# Patient Record
Sex: Male | Born: 1945 | Race: White | Hispanic: No | Smoking: Former smoker
Health system: Southern US, Community
[De-identification: ages and names within clinical notes are randomized; demographics above are authoritative.]

## PROBLEM LIST (undated history)

## (undated) DIAGNOSIS — I6509 Occlusion and stenosis of unspecified vertebral artery: Secondary | ICD-10-CM

## (undated) DIAGNOSIS — G8190 Hemiplegia, unspecified affecting unspecified side: Secondary | ICD-10-CM

## (undated) DIAGNOSIS — E785 Hyperlipidemia, unspecified: Secondary | ICD-10-CM

## (undated) DIAGNOSIS — I6350 Cerebral infarction due to unspecified occlusion or stenosis of unspecified cerebral artery: Secondary | ICD-10-CM

## (undated) DIAGNOSIS — F1910 Other psychoactive substance abuse, uncomplicated: Secondary | ICD-10-CM

## (undated) DIAGNOSIS — E119 Type 2 diabetes mellitus without complications: Secondary | ICD-10-CM

## (undated) DIAGNOSIS — I10 Essential (primary) hypertension: Secondary | ICD-10-CM

## (undated) HISTORY — DX: Hemiplegia, unspecified affecting unspecified side: G81.90

## (undated) HISTORY — DX: Type 2 diabetes mellitus without complications: E11.9

## (undated) HISTORY — DX: Occlusion and stenosis of unspecified vertebral artery: I65.09

## (undated) HISTORY — DX: Essential (primary) hypertension: I10

## (undated) HISTORY — DX: Hyperlipidemia, unspecified: E78.5

## (undated) HISTORY — DX: Other psychoactive substance abuse, uncomplicated: F19.10

---

## 2010-03-05 ENCOUNTER — Other Ambulatory Visit: Payer: Self-pay | Admitting: General Practice

## 2011-08-22 DIAGNOSIS — I6350 Cerebral infarction due to unspecified occlusion or stenosis of unspecified cerebral artery: Secondary | ICD-10-CM

## 2011-08-22 HISTORY — DX: Cerebral infarction due to unspecified occlusion or stenosis of unspecified cerebral artery: I63.50

## 2013-03-30 ENCOUNTER — Non-Acute Institutional Stay (SKILLED_NURSING_FACILITY): Payer: Medicare Other | Admitting: Internal Medicine

## 2013-03-30 DIAGNOSIS — D72829 Elevated white blood cell count, unspecified: Secondary | ICD-10-CM

## 2013-03-30 DIAGNOSIS — I10 Essential (primary) hypertension: Secondary | ICD-10-CM | POA: Insufficient documentation

## 2013-03-30 DIAGNOSIS — E785 Hyperlipidemia, unspecified: Secondary | ICD-10-CM | POA: Insufficient documentation

## 2013-03-30 DIAGNOSIS — I6350 Cerebral infarction due to unspecified occlusion or stenosis of unspecified cerebral artery: Secondary | ICD-10-CM

## 2013-03-30 DIAGNOSIS — E119 Type 2 diabetes mellitus without complications: Secondary | ICD-10-CM | POA: Insufficient documentation

## 2013-03-30 DIAGNOSIS — I639 Cerebral infarction, unspecified: Secondary | ICD-10-CM | POA: Insufficient documentation

## 2013-03-30 DIAGNOSIS — G609 Hereditary and idiopathic neuropathy, unspecified: Secondary | ICD-10-CM | POA: Insufficient documentation

## 2013-03-30 DIAGNOSIS — F29 Unspecified psychosis not due to a substance or known physiological condition: Secondary | ICD-10-CM

## 2013-03-30 NOTE — Progress Notes (Signed)
Patient ID: [name], male   DOB: [birth year], 67 y.o.   MRN: [id number] This is an acute visit.  Total care skilled.  Facility to excrete.  Chief complaint.  Medical management of hypertension CVA late effect hyperlipidemia diabetes depression neuropathy.  History of present illness.  Patient is a pleasant elderly resident with a history of CVA x2.  Initially during his stay here there were significant behavior issues However these have markedly improved here  He is on Seroquel as well as Prozac and this appears to be quite effective-he is bright alert somewhat confused however.  His other issues appear to be stable.  Diabetes appears to be under decent control on metformin CBGs ranged from the 80s-low 100s.  Blood pressure appears stable with recent readings 132/70-148/66-112/65.  His weight also continues to be stable he apparently eats quite well   In the past he did complain of some neuropathy in his legs at night he is on Neurontin twice a day and this appears to have helped significantly as well.  He has no complaints tonight is pleasant smiling.  Family medical social history has been reviewed per progress note of 12/24/2012.  Medications have been reviewed per MAR.  Review of systems-limited secondary to patient being a poor historian however he is not complaining of any shortness of breath chest pain or numbness.  He appears to be happy his only complaint is he would like his  Snack--.  Physical exam.  Temperature 97.1 pulse 66 respirations 19 blood pressure 132/70 weight is stable at 152.  In general this is a pleasant somewhat frail elderly male in no distress lying comfortably in bed.  His skin is warm and dry.  Eyes pupils appear equal round reactive to light sclera and conjunctiva are clear visual acuity appears intact.  Oropharynx clear mucous membranes moist he has numerous extractions.  Chest is clear to auscultation without rhonchi rales or  wheezes.  Heart is regular rate and rhythm without murmur gallop or rub.  He does not have any lower extremity edema.  Abdomen is soft nontender with positive bowel sounds.  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  Neurologic-as stated above cranial nerves appear grossly intact his speech is clear he is oriented to self is pleasant.  Labs.  02/23/2013.  Cholesterol was 146 triglycerides 164 HDL 28 LDL of 90.  03/26/2013.  Hemoglobin A1c 5.9. 12/29/2012.  WBC 9.1 hemoglobin 13.3 platelets 312.  BUN 12 creatinine 0.59 sodium 142 potassium 3.8.  Liver function tests within normal limits.  Assessment and plan.  #1 history of cerebral vascular disease-this appears to be stable he is on Plavix continued with some right-sided weakness which is not new.  #2-history diabetes type 2-this is quite stable on Glucophage CBG satisfactory as well as recent hemoglobin A1c.  #3-hypertension this appears stable he is on lisinopril.  Will update metabolic panel.  #4-hyperlipidemia-continues on pravastatin this appears to be relatively stable.  #5-history of anxiety-this appears significantly improved he has Ativan when necessary but does not appear to take this very much.  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  #7-neuropathy-this appears stable on Neurontin twice a day.  #8-he does have a history of leukocytosis that is asymptomatic-Will update CBC keep an eye on this  [id number]-of note 30 minutes spent assessing patient-and formulating a plan of  care for numerous diagnoses

## 2013-06-02 ENCOUNTER — Non-Acute Institutional Stay (SKILLED_NURSING_FACILITY): Payer: Medicare Other | Admitting: Internal Medicine

## 2013-06-02 DIAGNOSIS — G609 Hereditary and idiopathic neuropathy, unspecified: Secondary | ICD-10-CM

## 2013-06-02 DIAGNOSIS — I639 Cerebral infarction, unspecified: Secondary | ICD-10-CM

## 2013-06-02 DIAGNOSIS — E785 Hyperlipidemia, unspecified: Secondary | ICD-10-CM

## 2013-06-02 DIAGNOSIS — I10 Essential (primary) hypertension: Secondary | ICD-10-CM

## 2013-06-02 DIAGNOSIS — D72829 Elevated white blood cell count, unspecified: Secondary | ICD-10-CM

## 2013-06-02 DIAGNOSIS — I6350 Cerebral infarction due to unspecified occlusion or stenosis of unspecified cerebral artery: Secondary | ICD-10-CM

## 2013-06-02 DIAGNOSIS — E119 Type 2 diabetes mellitus without complications: Secondary | ICD-10-CM

## 2013-06-02 NOTE — Progress Notes (Signed)
Patient ID: [name], male   DOB: 21-Jul-1946, 67 y.o.   MRN: [id number] This is an acute visit.   Total care skilled.   Facility [facility]   Chief complaint.   Medical management of hypertension CVA late effect hyperlipidemia diabetes depression neuropathy.   History of present illness.   Patient is a pleasant elderly resident with a history of CVA x2.   Initially during his stay here there were significant behavior issues However these have markedly improved here   He is on Seroquel as well as Prozac and this appears to be quite effective-he is bright alert somewhat confused however.   His other issues appear to be stable.   Diabetes appears to be under decent control on metformin CBGs are mainly in the low 100s-recent hemoglobin A1c was satisfactory at 5.7.   Blood pressure appears stable with recent readings 128/72-120/60.   His weight also continues to be stable he apparently eats quite well    In the past he did complain of some neuropathy in his legs at night he is on Neurontin twice a day and this appears to have helped significantly as well.   He has no complaints tonight is pleasant smiling.   Family medical social history has been reviewed per progress note of 12/24/2012.   Medications have been reviewed per MAR.   Review of systems-limited secondary to patient being a poor historian however he is not complaining of any shortness of breath chest pain or numbness.   --.   Physical exam.   Temperature 98.4 pulse 74 respirations 18 blood pressure 128/72 weight is stable at 150.   In general this is a pleasant somewhat frail elderly male in no distress lying comfortably in bed.   His skin is warm and dry.   Eyes pupils appear equal round reactive to light sclera and conjunctiva are clear visual acuity appears intact.   Oropharynx clear mucous membranes moist he has numerous extractions.   Chest is clear to auscultation without  rhonchi rales or wheezes.   Heart is regular rate and rhythm without murmur gallop or rub.   He does not have any lower extremity edema.   Abdomen is soft nontender with positive bowel sounds.   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   Neurologic-as stated above cranial nerves appear grossly intact his speech is clear he is oriented to self is pleasant.   Labs.  04/22/2013.  Hemoglobin A1c 5.7.  04/01/2013.  WBC 11.2 hemoglobin 12.8 platelets 326.  Neutrophils 7.9.  Sodium 142 potassium 3.8 BUN 13 creatinine 0.66   02/23/2013.   Cholesterol was 146 triglycerides 164 HDL 28 LDL of 90.   03/26/2013.   Hemoglobin A1c 5.9. 12/29/2012.   WBC 9.1 hemoglobin 13.3 platelets 312.   BUN 12 creatinine 0.59 sodium 142 potassium 3.8.   Liver function tests within normal limits.   Assessment and plan.   #1 history of cerebral vascular disease-this appears to be stable he is on Plavix continues with some right-sided weakness which is not new.   #2-history diabetes type 2-this is quite stable on Glucophage CBG satisfactory as well as recent hemoglobin A1c.   #3-hypertension this appears stable he is on lisinopril.--Will update metabolic panel      #4-hyperlipidemia-continues on pravastatin this appears to be relatively stable.   #5-history of anxiety-this appears significantly improved he has Ativan when  necessary but does not appear to take this very much.   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   #7-neuropathy-this appears stable on Neurontin twice a day.   #8-he does have a history of leukocytosis that is asymptomatic-Will update CBC keep an eye on this   [phone]

## 2013-07-22 DIAGNOSIS — D72829 Elevated white blood cell count, unspecified: Secondary | ICD-10-CM | POA: Insufficient documentation

## 2013-08-02 ENCOUNTER — Encounter: Payer: Self-pay | Admitting: Nurse Practitioner

## 2013-08-03 ENCOUNTER — Encounter: Payer: Medicare Other | Admitting: Nurse Practitioner

## 2013-08-03 DIAGNOSIS — Z0289 Encounter for other administrative examinations: Secondary | ICD-10-CM

## 2013-08-17 ENCOUNTER — Non-Acute Institutional Stay (SKILLED_NURSING_FACILITY): Payer: Medicare Other | Admitting: Internal Medicine

## 2013-08-17 DIAGNOSIS — E119 Type 2 diabetes mellitus without complications: Secondary | ICD-10-CM

## 2013-08-17 DIAGNOSIS — D72829 Elevated white blood cell count, unspecified: Secondary | ICD-10-CM

## 2013-08-17 DIAGNOSIS — I6350 Cerebral infarction due to unspecified occlusion or stenosis of unspecified cerebral artery: Secondary | ICD-10-CM

## 2013-08-17 DIAGNOSIS — G609 Hereditary and idiopathic neuropathy, unspecified: Secondary | ICD-10-CM

## 2013-08-17 DIAGNOSIS — I639 Cerebral infarction, unspecified: Secondary | ICD-10-CM

## 2013-08-17 DIAGNOSIS — E785 Hyperlipidemia, unspecified: Secondary | ICD-10-CM

## 2013-08-17 DIAGNOSIS — I10 Essential (primary) hypertension: Secondary | ICD-10-CM

## 2013-08-17 NOTE — Progress Notes (Signed)
Patient ID: [name], male   DOB: May 20, 1946, 67 y.o.   MRN: [id number] This is a routine visit.  Level of care skilled  Facility [facility]   Chief complaint.  Medical management of hypertension CVA late effect hyperlipidemia diabetes depression neuropathy .  History of present illness.  Patient is a pleasant elderly resident with a history of CVA x2.  Initially during his stay here there were significant behavior issues However these have markedly improved here  He is on Seroquel as well as Prozac and this appears to be quite effective-he is bright alert somewhat confused however.  His other issues appear to be stable.  Diabetes appears to be under decent control on metformin CBGs are mainly in the low 100s-recent hemoglobin A1c was satisfactory at 5.7.  Blood pressure appears stable with recent readings 116/72-136/78.  His weight also continues to be stable he apparently eats quite well  In the past he did complain of some neuropathy in his legs at night he is on Neurontin twice a day and this appears to have helped significantly as well.  He has no complaints todayt is pleasant smiling .  Family medical social history has been reviewed per progress note of 12/24/2012 .  Medications have been reviewed per MAR.   Review of systems-limited secondary to patient being a poor historian however he is not complaining of any shortness of breath chest pain or numbness.  --.  Physical exam.  Temperature 98.4 pulse 74 respirations 18 blood pressure 128/72 weight is stable at 152 .  In general this is a pleasant somewhat frail elderly male in no distress lying comfortably in bed.  His skin is warm and dry.  Eyes--limited exam since patient shut his eyes quite tightly at . Any attempt at exam.  Oropharynx clear mucous membranes moist he has numerous extractions.  Chest is clear to auscultation without rhonchi rales or wheezes.  Heart is regular rate and rhythm without murmur gallop or  rub.  He does not have any lower extremity edema.  Abdomen is soft nontender with positive bowel sounds.  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  Neurologic-as stated above cranial nerves appear grossly intact his speech is clear he is oriented to self is pleasant .  Labs. 06/06/2013.  WBCs 9.7 hemoglobin 14.5 platelets 303.  BUN 14 creatinine 0.62 sodium 143 potassium 4.3.    04/22/2013.  Hemoglobin A1c 5.7.  04/01/2013.  WBC 11.2 hemoglobin 12.8 platelets 326.  Neutrophils 7.9.  Sodium 142 potassium 3.8 BUN 13 creatinine 0.66  02/23/2013.  Cholesterol was 146 triglycerides 164 HDL 28 LDL of 90.  03/26/2013.  Hemoglobin A1c 5.9.  12/29/2012.  WBC 9.1 hemoglobin 13.3 platelets 312.  BUN 12 creatinine 0.59 sodium 142 potassium 3.8.  Liver function tests within normal limits.  Assessment and plan.  #1 history of cerebral vascular disease-this appears to be stable he is on Plavix continues with some right-sided weakness which is not new.  #2-history diabetes type 2-this is quite stable on Glucophage CBG satisfactory as well --we'll update hemoglobin A1c.  #3-hypertension this appears stable he is on lisinopril.--Will update metabolic panel  #4-hyperlipidemia-continues on pravastatin --- we'll update lipid panel and liver function tests.  #5-history of anxiety-this appears significantly improved he has Ativan when necessary but does not appear to take this very much.  #6-history psychosis-this is significantly improved he is on Seroquel  as well as Prozac for coexistent depression.  #7-neuropathy-this appears stable on Neurontin twice a day.  #8-he does have a history of leukocytosis that is asymptomatic-Will update CBC keep an eye on this  [phone]

## 2013-08-24 ENCOUNTER — Non-Acute Institutional Stay (SKILLED_NURSING_FACILITY): Payer: Medicare Other | Admitting: Internal Medicine

## 2013-08-24 DIAGNOSIS — D72829 Elevated white blood cell count, unspecified: Secondary | ICD-10-CM

## 2013-08-24 NOTE — Progress Notes (Signed)
Patient ID: [name], male   DOB: 04/23/1946, 67 y.o.   MRN: [id number]  This is an acute visit.  Level of care skilled.  Facility to excrete.  Chief complaint-acute visit followup leukocytosis.  History of present illness.  Patient is an elderly resident who has been quite stable clinically he does have a history of somewhat persistent asymptomatic leukocytosis-we did order routine labs recently and has come back showing a mildly elevated white count of 11.3 with absolute granulocytes mildly elevated at 8.4.  Previous lab in August 2014 showed a white count of 9.7 and granulocytes of 6.3.  However back in June his white count was 11.2 with grands absolute of 7.9  In October 2013 his white count was 14.6 with grands of 11.6 at that time he was treated for Escherichia coli UTI which I suspect culture was obtained at that time because his white count was higher than it normally is.  He has been afebrile does not complaining of any dysuria or shortness of breath congestion-nursing staff has not noted anything different either no complaints of dysuria cough or congestion.  Family medical social history has been reviewed her previous progress notes including sedimentation rate 28 2014 medications have been reviewed per MAR.  Review of systems.  Somewhat unreliable secondary to dementia but he does not complaining of any dysuria when asked or shortness of breath or cough or congestion tells me he is doing fine basically just wants to sleep right now since it's fairly late.  Physical exam.  Temperature is 97.1 pulse 78 respirations 19 blood pressure 126/70.  General this is a somewhat frail elderly male in no distress resting comfortably in bed.  His skin is warm and dry I do not see any rashes or areas that would be concerning for cellulitis.  Oropharynx clear mucous membranes moist.  Chest is clear to auscultation with no labored breathing.  Heart is regular rate and rhythm  without murmur gallop or rub.  Abdomen soft nontender with active bowel sounds.  I could not appreciate any suprapubic tenderness.  Muscle skeletal has some mild right-sided weakness this was difficult to fully assess since he was in bed but I do not see anything grossly different from baseline.  Labs.  08/19/2013.  WBC 11.3 with granulocytes 8.4.  Hemoglobin 13.1 platelets 287.  Sodium 143 potassium 4 BUN 13 creatinine 0.63-liver function tests within normal limits.  Hemoglobin A1c was 6.3.  Cholesterol 141-triglycerides 225-HDL 25-LDL 71.  Assessment plan.  #1-leukocytosis-this appears to be asymptomatic once again this does vary it appears ranging from high normal to low elevated-this does not appear to be out of line with what he has run before and has been asymptomatic-will update this however next week to keep an eye on it.Marland Kitchen also monitor for any cough congestion fever or dysuria.  [id number]

## 2013-10-12 ENCOUNTER — Non-Acute Institutional Stay (SKILLED_NURSING_FACILITY): Payer: Medicare Other | Admitting: Internal Medicine

## 2013-10-12 DIAGNOSIS — D72829 Elevated white blood cell count, unspecified: Secondary | ICD-10-CM

## 2013-10-12 DIAGNOSIS — I10 Essential (primary) hypertension: Secondary | ICD-10-CM

## 2013-10-12 DIAGNOSIS — I639 Cerebral infarction, unspecified: Secondary | ICD-10-CM

## 2013-10-12 DIAGNOSIS — I6350 Cerebral infarction due to unspecified occlusion or stenosis of unspecified cerebral artery: Secondary | ICD-10-CM

## 2013-10-12 DIAGNOSIS — E785 Hyperlipidemia, unspecified: Secondary | ICD-10-CM

## 2013-10-12 DIAGNOSIS — G609 Hereditary and idiopathic neuropathy, unspecified: Secondary | ICD-10-CM

## 2013-10-12 DIAGNOSIS — E119 Type 2 diabetes mellitus without complications: Secondary | ICD-10-CM

## 2013-10-12 NOTE — Progress Notes (Signed)
Patient ID: [name], male   DOB: 11/03/1945, 67 y.o.   MRN: [id number] This is a routine visit.  Level of care skilled  Facility [facility]   Chief complaint.  Medical management of hypertension CVA late effect hyperlipidemia diabetes depression neuropathy  .  History of present illness.  Patient is a pleasant elderly resident with a history of CVA x2.  Initially during his stay here there were significant behavior issues However these have markedly improved here  He is on Seroquel as well as Prozac and this appears to be quite effective-he is bright alert somewhat confused however.  His other issues appear to be stable.  Diabetes appears to be under decent control on metformin CBGs are mainly in the low 100s-recent hemoglobin A1c was satisfactory at6.3  Blood pressure appears stable with recent readings 102/70-110/80-occasionally systolics in the 120s but this appears to be quite stable  His weight also continues to be stable he apparently eats quite well  In the past he did complain of some neuropathy in his legs at night he is on Neurontin twice a day and this appears to have helped significantly as well.  He has no complaints todayt is pleasant smiling He has a history of somewhat chronic leukocytosis -- lab done back in October showed mildly elevated at 11.3 with absolute neutrophils elevated at 8.4-updated lab was ordered I do not see those results we will go ahead and obtain another CBC --he is not apparently shown any signs of infection fever or chills dysuria or chest congestion- .  Family medical social history has been reviewed per progress note of 12/24/2012  .  Medications have been reviewed per MAR .  Review of systems-limited secondary to patient being a poor historian however he is not complaining of any shortness of breath chest pain or numbness cough has not complaining of any musculoskeletal pain.  --.  Physical exam Temperature is 97.4 pulse 70 respirations 14  blood pressure 102/70 weight is stable at 155 O2 saturation in the 90s on room air.    .  In general this is a pleasant somewhat frail elderly male in no distress lying comfortably in bed.  His skin is warm and dry.  Eyes--limited exam since patient shut his eyes quite tightly at . Any attempt at exam--up from what I can see sclera and conjunctiva are clear pupils appear to be reactive visual acuity appears grossly intact--.  Oropharynx clear mucous membranes moist he has numerous extractions.  Chest is clear to auscultation without rhonchi rales or wheezes.  Heart is regular rate and rhythm without murmur gallop or rub.  He does not have any lower extremity edema.  Abdomen is soft nontender with positive bowel sounds.  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  Neurologic-as stated above cranial nerves appear grossly intact his speech is clear he is oriented to self is pleasant  .  Labs.  08/19/2013.  WBC 11.3 hemoglobin 13.1 platelets 287.  Absolute neutrophils mildly elevated at 8.4.  Sodium 143 potassium 4 BUN 13 creatinine 0.63-liver function tests within normal limits.  Cholesterol 141 triglycerides 225 HDL 25 LDL 71.  Hemoglobin A1c 6.3  06/06/2013.  WBCs 9.7 hemoglobin 14.5 platelets 303.  BUN 14 creatinine 0.62 sodium 143 potassium 4.3.  04/22/2013.  Hemoglobin A1c 5.7.  04/01/2013.  WBC 11.2 hemoglobin 12.8 platelets 326.  Neutrophils 7.9.  Sodium 142 potassium 3.8 BUN  13 creatinine 0.66  02/23/2013.  Cholesterol was 146 triglycerides 164 HDL 28 LDL of 90.  03/26/2013.  Hemoglobin A1c 5.9.  12/29/2012.  WBC 9.1 hemoglobin 13.3 platelets 312.  BUN 12 creatinine 0.59 sodium 142 potassium 3.8.  Liver function tests within normal limits .  Assessment and plan.  #1 history of cerebral vascular disease-this appears to be stable he is on Plavix continues with  some right-sided weakness which is not new.  #2-history diabetes type 2-this is quite stable on Glucophage CBG satisfactory as well -well his hemoglobin A1c  #3-hypertension this appears stable he is on lisinopril.- #4-hyperlipidemia-continues on pravastatin --- cholesterol level appears stable-does have a low HDL Will discuss with Dr. Leanord Hawking about any treatment of this.  #5-history of anxiety-this appears significantly improved he has Ativan when necessary but does not appear to take this very much.  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  #7-neuropathy-this appears stable on Neurontin twice a day.  #8-he does have a history of leukocytosis that is asymptomatic-Will update CBC keep an eye on this  [phone]

## 2013-10-30 ENCOUNTER — Encounter (HOSPITAL_COMMUNITY): Payer: Self-pay | Admitting: Emergency Medicine

## 2013-10-30 ENCOUNTER — Emergency Department (HOSPITAL_COMMUNITY): Payer: Medicare Other

## 2013-10-30 ENCOUNTER — Inpatient Hospital Stay (HOSPITAL_COMMUNITY)
Admission: EM | Admit: 2013-10-30 | Discharge: 2013-11-03 | DRG: 071 | Disposition: A | Discharge status: Skilled Nursing Facility | Payer: Medicare Other | Attending: Internal Medicine | Admitting: Internal Medicine

## 2013-10-30 DIAGNOSIS — R569 Unspecified convulsions: Secondary | ICD-10-CM | POA: Diagnosis present

## 2013-10-30 DIAGNOSIS — E785 Hyperlipidemia, unspecified: Secondary | ICD-10-CM | POA: Diagnosis present

## 2013-10-30 DIAGNOSIS — I10 Essential (primary) hypertension: Secondary | ICD-10-CM | POA: Diagnosis present

## 2013-10-30 DIAGNOSIS — R4189 Other symptoms and signs involving cognitive functions and awareness: Secondary | ICD-10-CM | POA: Diagnosis present

## 2013-10-30 DIAGNOSIS — Z66 Do not resuscitate: Secondary | ICD-10-CM | POA: Diagnosis not present

## 2013-10-30 DIAGNOSIS — I639 Cerebral infarction, unspecified: Secondary | ICD-10-CM | POA: Diagnosis present

## 2013-10-30 DIAGNOSIS — I959 Hypotension, unspecified: Secondary | ICD-10-CM | POA: Diagnosis not present

## 2013-10-30 DIAGNOSIS — R4182 Altered mental status, unspecified: Secondary | ICD-10-CM | POA: Diagnosis present

## 2013-10-30 DIAGNOSIS — Z22322 Carrier or suspected carrier of Methicillin resistant Staphylococcus aureus: Secondary | ICD-10-CM

## 2013-10-30 DIAGNOSIS — D72829 Elevated white blood cell count, unspecified: Secondary | ICD-10-CM

## 2013-10-30 DIAGNOSIS — Z87891 Personal history of nicotine dependence: Secondary | ICD-10-CM

## 2013-10-30 DIAGNOSIS — G609 Hereditary and idiopathic neuropathy, unspecified: Secondary | ICD-10-CM

## 2013-10-30 DIAGNOSIS — E119 Type 2 diabetes mellitus without complications: Secondary | ICD-10-CM | POA: Diagnosis present

## 2013-10-30 DIAGNOSIS — R509 Fever, unspecified: Secondary | ICD-10-CM | POA: Diagnosis present

## 2013-10-30 DIAGNOSIS — I69959 Hemiplegia and hemiparesis following unspecified cerebrovascular disease affecting unspecified side: Secondary | ICD-10-CM

## 2013-10-30 DIAGNOSIS — G9340 Encephalopathy, unspecified: Principal | ICD-10-CM | POA: Diagnosis present

## 2013-10-30 HISTORY — DX: Cerebral infarction due to unspecified occlusion or stenosis of unspecified cerebral artery: I63.50

## 2013-10-30 LAB — ETHANOL

## 2013-10-30 LAB — TROPONIN I: Troponin I: <0.3 ng/mL (ref <0.30)

## 2013-10-30 LAB — RAPID URINE DRUG SCREEN, HOSP PERFORMED
AMPHETAMINES: NOT DETECTED
Barbiturates: NOT DETECTED
Benzodiazepines: NOT DETECTED
Cocaine: NOT DETECTED
Opiates: NOT DETECTED
TETRAHYDROCANNABINOL: NOT DETECTED

## 2013-10-30 LAB — URINALYSIS, ROUTINE W REFLEX MICROSCOPIC
Bilirubin Urine: NEGATIVE
Glucose, UA: NEGATIVE mg/dL
HGB URINE DIPSTICK: NEGATIVE
Ketones, ur: NEGATIVE mg/dL
Leukocytes, UA: NEGATIVE
Nitrite: NEGATIVE
PROTEIN: NEGATIVE mg/dL
Specific Gravity, Urine: 1.023 (ref 1.005–1.030)
Urobilinogen, UA: 0.2 mg/dL (ref 0.0–1.0)
pH: 5.5 (ref 5.0–8.0)

## 2013-10-30 LAB — POCT I-STAT, CHEM 8
BUN: 16 mg/dL (ref 6–23)
CHLORIDE: 104 meq/L (ref 96–112)
CREATININE: 0.7 mg/dL (ref 0.50–1.35)
Calcium, Ion: 1.16 mmol/L (ref 1.13–1.30)
Glucose, Bld: 161 mg/dL — ABNORMAL HIGH (ref 70–99)
HCT: 43 % (ref 39.0–52.0)
Hemoglobin: 14.6 g/dL (ref 13.0–17.0)
Potassium: 3.7 mEq/L (ref 3.7–5.3)
SODIUM: 142 meq/L (ref 137–147)
TCO2: 24 mmol/L (ref 0–100)

## 2013-10-30 LAB — COMPREHENSIVE METABOLIC PANEL
ALK PHOS: 52 U/L (ref 39–117)
ALT: 13 U/L (ref 0–53)
AST: 12 U/L (ref 0–37)
Albumin: 3.9 g/dL (ref 3.5–5.2)
BILIRUBIN TOTAL: 0.3 mg/dL (ref 0.3–1.2)
BUN: 15 mg/dL (ref 6–23)
CHLORIDE: 103 meq/L (ref 96–112)
CO2: 24 mEq/L (ref 19–32)
Calcium: 9 mg/dL (ref 8.4–10.5)
Creatinine, Ser: 0.63 mg/dL (ref 0.50–1.35)
GFR calc Af Amer: >90 mL/min (ref >90)
GFR calc non Af Amer: >90 mL/min (ref >90)
Glucose, Bld: 155 mg/dL — ABNORMAL HIGH (ref 70–99)
POTASSIUM: 3.8 meq/L (ref 3.7–5.3)
Sodium: 140 mEq/L (ref 137–147)
Total Protein: 7.2 g/dL (ref 6.0–8.3)

## 2013-10-30 LAB — DIFFERENTIAL
BASOS ABS: 0.1 10*3/uL (ref 0.0–0.1)
BASOS PCT: 1 % (ref 0–1)
Eosinophils Absolute: 0.2 10*3/uL (ref 0.0–0.7)
Eosinophils Relative: 2 % (ref 0–5)
LYMPHS PCT: 22 % (ref 12–46)
Lymphs Abs: 2.2 10*3/uL (ref 0.7–4.0)
Monocytes Absolute: 0.7 10*3/uL (ref 0.1–1.0)
Monocytes Relative: 7 % (ref 3–12)
NEUTROS ABS: 7 10*3/uL (ref 1.7–7.7)
Neutrophils Relative %: 69 % (ref 43–77)

## 2013-10-30 LAB — CBC
HCT: 41.1 % (ref 39.0–52.0)
Hemoglobin: 14 g/dL (ref 13.0–17.0)
MCH: 31.1 pg (ref 26.0–34.0)
MCHC: 34.1 g/dL (ref 30.0–36.0)
MCV: 91.3 fL (ref 78.0–100.0)
Platelets: 234 10*3/uL (ref 150–400)
RBC: 4.5 MIL/uL (ref 4.22–5.81)
RDW: 13.1 % (ref 11.5–15.5)
WBC: 10.2 10*3/uL (ref 4.0–10.5)

## 2013-10-30 LAB — CG4 I-STAT (LACTIC ACID): Lactic Acid, Venous: 2.17 mmol/L (ref 0.5–2.2)

## 2013-10-30 LAB — PROTIME-INR
INR: 0.98 (ref 0.00–1.49)
Prothrombin Time: 12.8 seconds (ref 11.6–15.2)

## 2013-10-30 LAB — POCT I-STAT TROPONIN I: Troponin i, poc: 0.01 ng/mL (ref 0.00–0.08)

## 2013-10-30 LAB — GLUCOSE, CAPILLARY
GLUCOSE-CAPILLARY: 97 mg/dL (ref 70–99)
Glucose-Capillary: 143 mg/dL — ABNORMAL HIGH (ref 70–99)

## 2013-10-30 LAB — APTT: APTT: 33 s (ref 24–37)

## 2013-10-30 MED ORDER — ENOXAPARIN SODIUM 40 MG/0.4ML ~~LOC~~ SOLN
40.0000 mg | SUBCUTANEOUS | Status: DC
  Administered 2013-10-30 – 2013-11-02 (×4): 40 mg via SUBCUTANEOUS
  Filled 2013-10-30 (×5): qty 0.4

## 2013-10-30 MED ORDER — VANCOMYCIN HCL IN DEXTROSE 1-5 GM/200ML-% IV SOLN: 1000.0000 mg | Freq: Once | INTRAVENOUS | Status: DC

## 2013-10-30 MED ORDER — INSULIN ASPART 100 UNIT/ML ~~LOC~~ SOLN
0.0000 [IU] | Freq: Three times a day (TID) | SUBCUTANEOUS | Status: DC
Start: 2013-10-31 — End: 2013-11-03
  Administered 2013-10-31: 2 [IU] via SUBCUTANEOUS
  Administered 2013-10-31 – 2013-11-01 (×2): 1 [IU] via SUBCUTANEOUS
  Administered 2013-11-01: 2 [IU] via SUBCUTANEOUS
  Administered 2013-11-02: 1 [IU] via SUBCUTANEOUS
  Administered 2013-11-03 (×2): 2 [IU] via SUBCUTANEOUS

## 2013-10-30 MED ORDER — ONDANSETRON HCL 4 MG/2ML IJ SOLN: 4.0000 mg | Freq: Four times a day (QID) | INTRAMUSCULAR | Status: DC | PRN

## 2013-10-30 MED ORDER — ACETAMINOPHEN 325 MG PO TABS: 650.0000 mg | ORAL_TABLET | Freq: Four times a day (QID) | ORAL | Status: DC | PRN

## 2013-10-30 MED ORDER — ACETAMINOPHEN 650 MG RE SUPP: 650.0000 mg | Freq: Four times a day (QID) | RECTAL | Status: DC | PRN

## 2013-10-30 MED ORDER — GABAPENTIN 100 MG PO CAPS
200.0000 mg | ORAL_CAPSULE | Freq: Every morning | ORAL | Status: DC
  Administered 2013-10-31 – 2013-11-03 (×4): 200 mg via ORAL
  Filled 2013-10-30 (×4): qty 2

## 2013-10-30 MED ORDER — LISINOPRIL 20 MG PO TABS
20.0000 mg | ORAL_TABLET | Freq: Every day | ORAL | Status: DC
  Administered 2013-10-31 – 2013-11-01 (×2): 20 mg via ORAL
  Filled 2013-10-30 (×2): qty 1

## 2013-10-30 MED ORDER — SODIUM CHLORIDE 0.9 % IJ SOLN
3.0000 mL | Freq: Two times a day (BID) | INTRAMUSCULAR | Status: DC
  Administered 2013-10-30 – 2013-11-01 (×4): 3 mL via INTRAVENOUS

## 2013-10-30 MED ORDER — SIMVASTATIN 10 MG PO TABS
10.0000 mg | ORAL_TABLET | Freq: Every day | ORAL | Status: DC
  Administered 2013-11-01 – 2013-11-03 (×3): 10 mg via ORAL
  Filled 2013-10-30 (×4): qty 1

## 2013-10-30 MED ORDER — PIPERACILLIN-TAZOBACTAM 3.375 G IVPB 30 MIN
3.3750 g | Freq: Once | INTRAVENOUS | Status: DC
Start: 2013-10-30 — End: 2013-10-30

## 2013-10-30 MED ORDER — ONDANSETRON HCL 4 MG PO TABS: 4.0000 mg | ORAL_TABLET | Freq: Four times a day (QID) | ORAL | Status: DC | PRN

## 2013-10-30 MED ORDER — DOCUSATE SODIUM 50 MG/5ML PO LIQD
100.0000 mg | Freq: Two times a day (BID) | ORAL | Status: DC
  Administered 2013-10-31 – 2013-11-03 (×7): 100 mg via ORAL
  Filled 2013-10-30 (×10): qty 10

## 2013-10-30 MED ORDER — FOLIC ACID 1 MG PO TABS
1.0000 mg | ORAL_TABLET | Freq: Every day | ORAL | Status: DC
Start: 2013-10-31 — End: 2013-11-03
  Administered 2013-10-31 – 2013-11-03 (×4): 1 mg via ORAL
  Filled 2013-10-30 (×4): qty 1

## 2013-10-30 MED ORDER — SODIUM CHLORIDE 0.9 % IV SOLN
INTRAVENOUS | Status: AC
  Administered 2013-10-30: 1000 mL via INTRAVENOUS

## 2013-10-30 MED ORDER — CLOPIDOGREL BISULFATE 75 MG PO TABS
75.0000 mg | ORAL_TABLET | Freq: Every day | ORAL | Status: DC
  Administered 2013-10-31 – 2013-11-03 (×4): 75 mg via ORAL
  Filled 2013-10-30 (×7): qty 1

## 2013-10-30 MED ORDER — POLYETHYLENE GLYCOL 3350 17 G PO PACK
17.0000 g | PACK | Freq: Every day | ORAL | Status: DC
  Administered 2013-10-31 – 2013-11-03 (×4): 17 g via ORAL
  Filled 2013-10-30 (×4): qty 1

## 2013-10-30 NOTE — ED Notes (Signed)
Receiving RN unable to take report at this time.

## 2013-10-30 NOTE — Progress Notes (Signed)
Questioned criticality of patient; asked rapid response nurse to evaluate patient for possibility of stepdown bed status.

## 2013-10-30 NOTE — ED Notes (Signed)
Admitting MD will put in a bed for step down

## 2013-10-30 NOTE — ED Notes (Signed)
Patient transported to MRI transported by Elissa Heftyenise RR RN

## 2013-10-30 NOTE — ED Notes (Signed)
Heather S. EMT applying warm blankets

## 2013-10-30 NOTE — ED Notes (Signed)
This RN attempted to give report again to 4N, Tammy RN states she can not accept pt with a rectal temp <97.0 rectal

## 2013-10-30 NOTE — H&P (Addendum)
[facility] History and Physical  [name] ZOX:[id number] DOB: November 21, 1945 DOA: 10/30/2013  Referring physician:  PCP: Terald Sleeper, MD  Specialists:   Chief Complaint: change in mental status   HPI: [name] is a 68 y.o. male with PMH of HTN, HPL, DM, h/o CVA with Right hemiparesis presented from NH with change in mental status; He is resident of [facility] nursing home in [place]. Per nursing home staff, he is normally alert and communicative. He is oriented to person only at baseline. They state he was in his normal state of health about 8:00 this morning and when he went back to check on him at 9:00 they noticed him less responsive and drooling out the right side of his mouth. -no h/o fever, chest pain, no nausea vomiting or diarrhea -ED MRI head did not show acute stroke; he was evaluated by neurology who recommended observation and EEG r/o seizure     Review of Systems: The patient denies anorexia, fever, weight loss,, vision loss, decreased hearing, hoarseness, chest pain, yspnea on exertion, peripheral edema, balance deficits, hemoptysis, abdominal pain, melena, hematochezia, severe indigestion/heartburn, hematuria, incontinence, genital sores, muscle weakness, suspicious skin lesions, depression, unusual weight change, abnormal bleeding, enlarged lymph nodes, angioedema, and breast masses.    Past Medical History  Diagnosis Date  . Hyperlipemia   . Diabetes   . Hemiplegia   . Vertebral artery occlusion   . Substance abuse   . Hypertension   . Cerebral artery occlusion with cerebral infarction 08/22/2011   History reviewed. No pertinent past surgical history. Social History:  reports that he has quit smoking. He does not have any smokeless tobacco history on file. He reports that he uses illicit drugs. His alcohol history is not on file. NH;  where does patient live--home, ALF, SNF? and with whom if at home? No;  Can patient participate in  ADLs?  No Known Allergies  History reviewed. No pertinent family history.  (be sure to complete)  Prior to Admission medications   Medication Sig Start Date End Date Taking? Authorizing Provider  clopidogrel (PLAVIX) 75 MG tablet Take 75 mg by mouth daily with breakfast.   Yes Historical Provider, MD  docusate (COLACE) 50 MG/5ML liquid Take 100 mg by mouth 2 (two) times daily.   Yes Historical Provider, MD  folic acid (FOLVITE) 1 MG tablet Take 1 mg by mouth daily.   Yes Historical Provider, MD  gabapentin (NEURONTIN) 100 MG capsule Take 200 mg by mouth every morning.   Yes Historical Provider, MD  gabapentin (NEURONTIN) 400 MG capsule Take 400 mg by mouth at bedtime.   Yes Historical Provider, MD  lisinopril (PRINIVIL,ZESTRIL) 20 MG tablet Take 20 mg by mouth daily.   Yes Historical Provider, MD  metFORMIN (GLUCOPHAGE) 1000 MG tablet Take 1,000 mg by mouth 2 (two) times daily with a meal.   Yes Historical Provider, MD  Multiple Vitamins-Minerals (CERTAGEN PO) Take 1 tablet by mouth daily.   Yes Historical Provider, MD  polyethylene glycol (MIRALAX / GLYCOLAX) packet Take 17 g by mouth daily.   Yes Historical Provider, MD  pravastatin (PRAVACHOL) 20 MG tablet Take 20 mg by mouth daily.   Yes Historical Provider, MD  QUEtiapine (SEROQUEL) 25 MG tablet Take 12.5 mg by mouth 2 (two) times daily.   Yes Historical Provider, MD   Physical Exam: Filed Vitals:   10/30/13 1400  BP: 170/106  Pulse: 80  Temp:   Resp: 15     General:  Alert,  confused  Eyes: eom-i, perrla   ENT: no oral ulcers   Neck: supple   Cardiovascular: s1,s2 rrr  Respiratory: CTA BL  Abdomen: soft, nt, nd   Skin: no rash  Musculoskeletal: no LE edema  Psychiatric: no hallucinations   Neurologic: R hemiparesis   Labs on Admission:  Basic Metabolic Panel:  Recent Labs Lab 10/30/13 1041 10/30/13 1045  NA 140 142  K 3.8 3.7  CL 103 104  CO2 24  --   GLUCOSE 155* 161*  BUN 15 16  CREATININE 0.63  0.70  CALCIUM 9.0  --    Liver Function Tests:  Recent Labs Lab 10/30/13 1041  AST 12  ALT 13  ALKPHOS 52  BILITOT 0.3  PROT 7.2  ALBUMIN 3.9   No results found for this basename: LIPASE, AMYLASE,  in the last 168 hours No results found for this basename: AMMONIA,  in the last 168 hours CBC:  Recent Labs Lab 10/30/13 1041 10/30/13 1045  WBC 10.2  --   NEUTROABS 7.0  --   HGB 14.0 14.6  HCT 41.1 43.0  MCV 91.3  --   PLT 234  --    Cardiac Enzymes:  Recent Labs Lab 10/30/13 1041  TROPONINI <0.30    BNP (last 3 results) No results found for this basename: PROBNP,  in the last 8760 hours CBG:  Recent Labs Lab 10/30/13 1042  GLUCAP 143*    Radiological Exams on Admission: Ct Head Wo Contrast  10/30/2013   CLINICAL DATA:  Mental status change with acute episode of unresponsiveness. Code stroke. History of stroke.  EXAM: CT HEAD WITHOUT CONTRAST  TECHNIQUE: Contiguous axial images were obtained from the base of the skull through the vertex without intravenous contrast.  COMPARISON:  None.  FINDINGS: There is right parietal encephalomalacia consistent with an old right MCA stroke. Atrophy and periventricular white matter disease are noted. There is no evidence of acute intracranial hemorrhage, mass lesion, brain edema or extra-axial fluid collection. There is no hydrocephalus.  The visualized paranasal sinuses, mastoid air cells and middle ears are clear. The calvarium is intact.  IMPRESSION: 1. No evidence of acute intracranial hemorrhage or acute stroke. 2. Old right MCA distribution infarct with atrophy and chronic small vessel ischemic changes. 3. These results were called by telephone at the time of interpretation on 10/30/2013 at 11:00 AM to Dr. Shawna Orleans BELFI , who verbally acknowledged these results.   Electronically Signed   By: Roxy Horseman M.D.   On: 10/30/2013 11:00   Mr Maxine Glenn Head Wo Contrast  10/30/2013   CLINICAL DATA:  Acute onset of unresponsiveness.  EXAM: MRI  HEAD WITHOUT CONTRAST  MRA HEAD WITHOUT CONTRAST  TECHNIQUE: Multiplanar, multiecho pulse sequences of the brain and surrounding structures were obtained without intravenous contrast. Angiographic images of the head were obtained using MRA technique without contrast.  COMPARISON:  Head CT earlier the same day  FINDINGS: MRI HEAD FINDINGS  There is no evidence of acute infarct. Right frontoparietal encephalomalacia is consistent with remote MCA infarct. Encephalomalacia/atrophy is noted involving the left cerebral peduncle. Small, old cerebellar infarcts are noted, right greater than left. Periventricular white matter T2 hyperintensities are compatible with moderate chronic small vessel ischemic disease. There is moderate cerebral atrophy, mildly advanced for age. Remote microhemorrhages are noted in the right greater than left cerebellar hemispheres as well as left basal ganglia. There is no evidence of mass, midline shift, or extra-axial fluid collection. Orbits are unremarkable. Major intracranial vascular flow  voids are unremarkable. Visualized paranasal sinuses and mastoid air cells are clear. Calvarium is normal in signal.  MRA HEAD FINDINGS  Images are mildly degraded by motion artifact. Visualized distal left vertebral artery is patent and dominant, demonstrating mild diffuse irregularity. The distal right vertebral artery is not well identified. Basilar artery is patent with mild irregularity but no evidence of high-grade stenosis. Left PICA appears patent, although its origin is not well evaluated. SCAs are patent bilaterally. Right PCA origin is patent. Left P1 segment appears either occluded or congenitally hypoplastic/aplastic. There is a small left posterior communicating artery. Minimal, intermittent flow is identified in left PCA branches more distally. There is mild to moderate irregularity of the right PCA.  Internal carotid arteries are patent from skullbase to carotid terminus. The left  intracranial internal carotid artery appears mildly dominant compared to the right. There is mild, diffuse narrowing and irregularity of the left carotid siphon and supraclinoid left ICA. The right A1 segment is markedly hypoplastic. Right A2 segment is predominantly supplied via the anterior communicating artery. The left ACA is unremarkable. MCA origins are patent bilaterally. Visualized left MCA branches are patent with mild irregularity. Right MCA trifurcation is patent, however there is occlusion of small distal branches likely related to prior infarct. There also a few areas of apparent moderate focal stenosis involving proximal M2 branches on the right. No intracranial aneurysm is identified.  IMPRESSION: 1. No evidence of acute infarct or other acute intracranial abnormality. 2. Remote right MCA and bilateral cerebellar infarcts. 3. Mild to moderate irregularity and narrowing of right greater than left MCA branches consistent with atherosclerosis. 4. Occlusion of distal right MCA branches, likely related to prior infarct. 5. Mild to moderate posterior circulation irregularity, consistent with atherosclerosis. Left P1 segment appears either occluded or congenitally aplastic, with minimal flow in more distal left PCA branches. 6. 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   Electronically Signed   By: Sebastian Ache   On: 10/30/2013 12:30   Mr Brain Wo Contrast  10/30/2013   CLINICAL DATA:  Acute onset of unresponsiveness.  EXAM: MRI HEAD WITHOUT CONTRAST  MRA HEAD WITHOUT CONTRAST  TECHNIQUE: Multiplanar, multiecho pulse sequences of the brain and surrounding structures were obtained without intravenous contrast. Angiographic images of the head were obtained using MRA technique without contrast.  COMPARISON:  Head CT earlier the same day  FINDINGS: MRI HEAD FINDINGS  There is no evidence of acute infarct. Right  frontoparietal encephalomalacia is consistent with remote MCA infarct. Encephalomalacia/atrophy is noted involving the left cerebral peduncle. Small, old cerebellar infarcts are noted, right greater than left. Periventricular white matter T2 hyperintensities are compatible with moderate chronic small vessel ischemic disease. There is moderate cerebral atrophy, mildly advanced for age. Remote microhemorrhages are noted in the right greater than left cerebellar hemispheres as well as left basal ganglia. There is no evidence of mass, midline shift, or extra-axial fluid collection. Orbits are unremarkable. Major intracranial vascular flow voids are unremarkable. Visualized paranasal sinuses and mastoid air cells are clear. Calvarium is normal in signal.  MRA HEAD FINDINGS  Images are mildly degraded by motion artifact. Visualized distal left vertebral artery is patent and dominant, demonstrating mild diffuse irregularity. The distal right vertebral artery is not well identified. Basilar artery is patent with mild irregularity but no evidence of high-grade stenosis. Left PICA appears patent, although its origin is not well evaluated. SCAs are patent bilaterally. Right PCA origin is  patent. Left P1 segment appears either occluded or congenitally hypoplastic/aplastic. There is a small left posterior communicating artery. Minimal, intermittent flow is identified in left PCA branches more distally. There is mild to moderate irregularity of the right PCA.  Internal carotid arteries are patent from skullbase to carotid terminus. The left intracranial internal carotid artery appears mildly dominant compared to the right. There is mild, diffuse narrowing and irregularity of the left carotid siphon and supraclinoid left ICA. The right A1 segment is markedly hypoplastic. Right A2 segment is predominantly supplied via the anterior communicating artery. The left ACA is unremarkable. MCA origins are patent bilaterally. Visualized  left MCA branches are patent with mild irregularity. Right MCA trifurcation is patent, however there is occlusion of small distal branches likely related to prior infarct. There also a few areas of apparent moderate focal stenosis involving proximal M2 branches on the right. No intracranial aneurysm is identified.  IMPRESSION: 1. No evidence of acute infarct or other acute intracranial abnormality. 2. Remote right MCA and bilateral cerebellar infarcts. 3. Mild to moderate irregularity and narrowing of right greater than left MCA branches consistent with atherosclerosis. 4. Occlusion of distal right MCA branches, likely related to prior infarct. 5. Mild to moderate posterior circulation irregularity, consistent with atherosclerosis. Left P1 segment appears either occluded or congenitally aplastic, with minimal flow in more distal left PCA branches. 6. 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   Electronically Signed   By: Sebastian AcheAllen  Grady   On: 10/30/2013 12:30   Dg Chest Port 1 View  10/30/2013   CLINICAL DATA:  Shortness of breath.  Altered mental status  EXAM: PORTABLE CHEST - 1 VIEW  COMPARISON:  None  FINDINGS: Difficult positioning required holding the patient during imaging. The costophrenic angles were excluded.  Old right rib deformity from prior fracture. The lungs appear clear. Cardiac and mediastinal margins appear normal.  IMPRESSION: 1. No acute thoracic findings.   Electronically Signed   By: Herbie BaltimoreWalt  Liebkemann M.D.   On: 10/30/2013 12:58    EKG: Independently reviewed. nsr  Assessment/Plan Principal Problem:   Altered mental status Active Problems:   HTN (hypertension)   CVA (cerebral infarction)   Type II or unspecified type diabetes mellitus without mention of complication, not stated as uncontrolled  68 y.o. male with PMH of HTN, HPL, DM, h/o CVA with Right hemiparesis presented from NH with  change in mental status  1. Change in mental status of unclear etiology; r/o seizure; no s/s of systemic infection  -symptoms are resolving; mental status currently at baseline; MRI: no acute findings, multiple old CVA, occulusions;  -per neurology pend EEG r/o seizure; hold Seroquel, and PM dose neurontindue to unresposiveness;   2. HTN resume home regimen; prn hydralazine, titrate PO meds as needed    3. DM no recent HA1c; cont iSS; check a1c  4. H/o CVA with R hemiparesis;  -cont plavix, statin, control BP; snf upon d/c   5. hypothermia in ED; unclear etiology; no s/s of infection; apply waring blankets; check tsh   Likely d/c in AM post EEG, if stable  Neurology;  if consultant consulted, please document name and whether formally or informally consulted  Code Status: full (must indicate code status--if unknown or must be presumed, indicate so) Family Communication: d/w patient's sisters at bedside  (indicate person spoken with, if applicable, with phone number if by telephone) Disposition Plan: SNF likely in AM  (indicate anticipated LOS)  Time spent: >35 minutes   Esperanza Sheets [facility] Pager [phone]  If 7PM-7AM, please contact night-coverage [email or web address] Password [email or web address] 10/30/2013, 2:46 PM

## 2013-10-30 NOTE — ED Notes (Addendum)
MD Camilo at bedside.

## 2013-10-30 NOTE — ED Notes (Signed)
Patient transported to floor by myself and NT GrenadaBrittany P.

## 2013-10-30 NOTE — ED Notes (Addendum)
Dr Buriev at bedside. 

## 2013-10-30 NOTE — ED Notes (Signed)
Patient transported to CT by Chancy Milroyenise RR RN

## 2013-10-30 NOTE — Consult Note (Signed)
Referring Physician: ED    Chief Complaint: CODE STROKE: UNRESPONSIVENESS, RIGHT FACE DROOP WITH DROOLING.  HPI:                                                                                                                                         [name] is an 68 y.o. male with a past medical history significant for HTN, DM, hyperlipidemia, stroke with residual right hemiparesis, substance abuse, brought to [facility] ED by EMS as a code stroke due to the above stated symptoms/signs. He is a nursing home resident and was last known well at 8 am this morning. He reportedly woke up, tooh his medications, had breakfast, and then around 930 am was found unresponsive with abnormal posturing,  gugling and moaning, with right face drop and drooling. EMS was summoned and patient brought to [facility] ED. Initial NIHSS 27. CT brain revealed no acute abnormality.  Date last known well: 10/30/13 Time last known well: 8 am tPA Given: no NIHSS: 27   Past Medical History  Diagnosis Date  . Hyperlipemia   . Diabetes   . Hemiplegia   . Vertebral artery occlusion   . Substance abuse   . Hypertension   . Cerebral artery occlusion with cerebral infarction 08/22/2011    History reviewed. No pertinent past surgical history.  History reviewed. No pertinent family history. Social History:  reports that he has quit smoking. He does not have any smokeless tobacco history on file. He reports that he uses illicit drugs. His alcohol history is not on file.  Allergies: No Known Allergies  Medications:                                                                                                                           I have reviewed the patient's current medications.  ROS:  History obtained from unobtainable from patient due to mental status  Physical exam: unresponsive. Blood  pressure 155/89, pulse 66, resp. rate 11, height 5' 8"  (1.727 m), weight 70.761 kg (156 lb), SpO2 94.00%. Head: normocephalic. Neck: supple, no bruits, no JVD. Cardiac: no murmurs. Lungs: clear. Abdomen: soft, no tender, no mass. Extremities: no edema.  Neurologic Examination:                                                                                                      Mental status: unresponsive. CN 2-12: pupils 4 mm bilaterally, reactive to light. No gaze preference. Can not get him to move his eyes even with Doll's maneuver. Doesn't blink to threat. Face symmetric. Tongue midline. Motor: old right HP.  Left sided weakness, leg greater than arm. Sensory: feels pain in the right arm but no reaction to pain in the left side or right leg. DTRs: 2 all over. Plantars: right upgoing, left downgoing. Coordination and gait: unable to test. No meningeal signs.  Results for orders placed during the hospital encounter of 10/30/13 (from the past 48 hour(s))  ETHANOL     Status: None   Collection Time    10/30/13 10:41 AM      Result Value Range   Alcohol, Ethyl (B) <11  0 - 11 mg/dL   Comment:            LOWEST DETECTABLE LIMIT FOR     SERUM ALCOHOL IS 11 mg/dL     FOR MEDICAL PURPOSES ONLY  PROTIME-INR     Status: None   Collection Time    10/30/13 10:41 AM      Result Value Range   Prothrombin Time 12.8  11.6 - 15.2 seconds   INR 0.98  0.00 - 1.49  APTT     Status: None   Collection Time    10/30/13 10:41 AM      Result Value Range   aPTT 33  24 - 37 seconds  CBC     Status: None   Collection Time    10/30/13 10:41 AM      Result Value Range   WBC 10.2  4.0 - 10.5 K/uL   RBC 4.50  4.22 - 5.81 MIL/uL   Hemoglobin 14.0  13.0 - 17.0 g/dL   HCT 41.1  39.0 - 52.0 %   MCV 91.3  78.0 - 100.0 fL   MCH 31.1  26.0 - 34.0 pg   MCHC 34.1  30.0 - 36.0 g/dL   RDW 13.1  11.5 - 15.5 %   Platelets 234  150 - 400 K/uL  DIFFERENTIAL     Status: None   Collection Time    10/30/13  10:41 AM      Result Value Range   Neutrophils Relative % 69  43 - 77 %   Neutro Abs 7.0  1.7 - 7.7 K/uL   Lymphocytes Relative 22  12 - 46 %   Lymphs Abs 2.2  0.7 - 4.0 K/uL   Monocytes Relative 7  3 - 12 %   Monocytes  Absolute 0.7  0.1 - 1.0 K/uL   Eosinophils Relative 2  0 - 5 %   Eosinophils Absolute 0.2  0.0 - 0.7 K/uL   Basophils Relative 1  0 - 1 %   Basophils Absolute 0.1  0.0 - 0.1 K/uL  COMPREHENSIVE METABOLIC PANEL     Status: Abnormal   Collection Time    10/30/13 10:41 AM      Result Value Range   Sodium 140  137 - 147 mEq/L   Comment: Please note change in reference range.   Potassium 3.8  3.7 - 5.3 mEq/L   Comment: Please note change in reference range.   Chloride 103  96 - 112 mEq/L   CO2 24  19 - 32 mEq/L   Glucose, Bld 155 (*) 70 - 99 mg/dL   BUN 15  6 - 23 mg/dL   Creatinine, Ser 0.63  0.50 - 1.35 mg/dL   Calcium 9.0  8.4 - 10.5 mg/dL   Total Protein 7.2  6.0 - 8.3 g/dL   Albumin 3.9  3.5 - 5.2 g/dL   AST 12  0 - 37 U/L   ALT 13  0 - 53 U/L   Alkaline Phosphatase 52  39 - 117 U/L   Total Bilirubin 0.3  0.3 - 1.2 mg/dL   GFR calc non Af Amer >90  >90 mL/min   GFR calc Af Amer >90  >90 mL/min   Comment: (NOTE)     The eGFR has been calculated using the CKD EPI equation.     This calculation has not been validated in all clinical situations.     eGFR's persistently <90 mL/min signify possible Chronic Kidney     Disease.  TROPONIN I     Status: None   Collection Time    10/30/13 10:41 AM      Result Value Range   Troponin I <0.30  <0.30 ng/mL   Comment:            Due to the release kinetics of cTnI,     a negative result within the first hours     of the onset of symptoms does not rule out     myocardial infarction with certainty.     If myocardial infarction is still suspected,     repeat the test at appropriate intervals.  GLUCOSE, CAPILLARY     Status: Abnormal   Collection Time    10/30/13 10:42 AM      Result Value Range   Glucose-Capillary  143 (*) 70 - 99 mg/dL  POCT I-STAT TROPONIN I     Status: None   Collection Time    10/30/13 10:44 AM      Result Value Range   Troponin i, poc 0.01  0.00 - 0.08 ng/mL   Comment 3            Comment: Due to the release kinetics of cTnI,     a negative result within the first hours     of the onset of symptoms does not rule out     myocardial infarction with certainty.     If myocardial infarction is still suspected,     repeat the test at appropriate intervals.  POCT I-STAT, CHEM 8     Status: Abnormal   Collection Time    10/30/13 10:45 AM      Result Value Range   Sodium 142  137 - 147 mEq/L   Potassium 3.7  3.7 - 5.3  mEq/L   Chloride 104  96 - 112 mEq/L   BUN 16  6 - 23 mg/dL   Creatinine, Ser 0.70  0.50 - 1.35 mg/dL   Glucose, Bld 161 (*) 70 - 99 mg/dL   Calcium, Ion 1.16  1.13 - 1.30 mmol/L   TCO2 24  0 - 100 mmol/L   Hemoglobin 14.6  13.0 - 17.0 g/dL   HCT 43.0  39.0 - 52.0 %   Ct Head Wo Contrast  10/30/2013   CLINICAL DATA:  Mental status change with acute episode of unresponsiveness. Code stroke. History of stroke.  EXAM: CT HEAD WITHOUT CONTRAST  TECHNIQUE: Contiguous axial images were obtained from the base of the skull through the vertex without intravenous contrast.  COMPARISON:  None.  FINDINGS: There is right parietal encephalomalacia consistent with an old right MCA stroke. Atrophy and periventricular white matter disease are noted. There is no evidence of acute intracranial hemorrhage, mass lesion, brain edema or extra-axial fluid collection. There is no hydrocephalus.  The visualized paranasal sinuses, mastoid air cells and middle ears are clear. The calvarium is intact.  IMPRESSION: 1. No evidence of acute intracranial hemorrhage or acute stroke. 2. Old right MCA distribution infarct with atrophy and chronic small vessel ischemic changes. 3. These results were called by telephone at the time of interpretation on 10/30/2013 at 11:00 AM to Dr. Threasa Beards BELFI , who verbally  acknowledged these results.   Electronically Signed   By: Camie Patience M.D.   On: 10/30/2013 11:00     Assessment: 68 y.o. male with multiple risk factors for stroke, brought in due to altered mental status, and right face  weakness with drooling. NIHSS 27. CT brain showed no acute abnormality. Many uncertainties with patient current presentation. Will go ahead and obtain MRI/MRA brain and make further decision accordingly, but if his high NIHSS makes me concern for a big territorial infarct.   Can not entirely exclude a seizure with prolonged post ictal state and thus will also obtain EEG.  Stroke Risk Factors -HTN, DM, hyperlipidemia, stroke  Dorian Pod, MD [facility] [phone]  10/30/2013, 11:53 AM    Addendum: MRI brain showed no acute abnormality and MRA brain reveals no evidence of acute thrombus/clot.

## 2013-10-30 NOTE — ED Notes (Signed)
Family at bedside. 

## 2013-10-30 NOTE — ED Notes (Addendum)
[facility] Staff called 911 at 0900 due to a change in pt's mental status. Staff reports pt was communicating at 0800 when breakfast and am medications given, staff then found pt 0900 unresponsive, with abnormal posturing, gurgling and moaning response only, not following commands, and drooling from Right side of mouth. 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 VSS BP 126/84, HR 76, RR 28, SPO2 100 at 15 L NRB, 16 G LLFA SL

## 2013-10-30 NOTE — ED Provider Notes (Addendum)
CSN: [id number]     Arrival date & time 10/30/13  1030 History   First MD Initiated Contact with Patient 10/30/13 1038     Chief Complaint  Patient presents with  . Code Stroke   (Consider location/radiation/quality/duration/timing/severity/associated sxs/prior Treatment) HPI Comments: Patient is brought in by [facility] for altered mental status. He is resident of [facility] nursing home in [place]. He has a history of diabetes hyperlipidemia, and CVA  x2. 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 Per nursing home staff, he is normally alert and communicative. He is oriented to person only at baseline. They state he was in his normal state of health about 8:00 this morning and when he went back to check on him at 9:00 they noticed him less responsive and drooling out the right side of his mouth.   Past Medical History  Diagnosis Date  . Hyperlipemia   . Diabetes   . Hemiplegia   . Vertebral artery occlusion   . Substance abuse   . Hypertension   . Cerebral artery occlusion with cerebral infarction 08/22/2011   History reviewed. No pertinent past surgical history. History reviewed. No pertinent family history. History  Substance Use Topics  . Smoking status: Former Smoker -- 50 years  . Smokeless tobacco: Not on file  . Alcohol Use: Not on file    Review of Systems  Unable to perform ROS   Allergies  Review of patient's allergies indicates no known allergies.  Home Medications   Current Outpatient Rx  Name  Route  Sig  Dispense  Refill  . clopidogrel (PLAVIX) 75 MG tablet   Oral   Take 75 mg by mouth daily with breakfast.         . docusate (COLACE) 50 MG/5ML liquid   Oral   Take 100 mg by mouth 2 (two) times daily.         . folic acid (FOLVITE) 1 MG tablet   Oral   Take 1 mg by mouth daily.         Marland Kitchen gabapentin (NEURONTIN) 100 MG capsule   Oral   Take 200 mg by mouth every morning.         . gabapentin  (NEURONTIN) 400 MG capsule   Oral   Take 400 mg by mouth at bedtime.         Marland Kitchen lisinopril (PRINIVIL,ZESTRIL) 20 MG tablet   Oral   Take 20 mg by mouth daily.         . metFORMIN (GLUCOPHAGE) 1000 MG tablet   Oral   Take 1,000 mg by mouth 2 (two) times daily with a meal.         . Multiple Vitamins-Minerals (CERTAGEN PO)   Oral   Take 1 tablet by mouth daily.         . polyethylene glycol (MIRALAX / GLYCOLAX) packet   Oral   Take 17 g by mouth daily.         . pravastatin (PRAVACHOL) 20 MG tablet   Oral   Take 20 mg by mouth daily.         . QUEtiapine (SEROQUEL) 25 MG tablet   Oral   Take 12.5 mg by mouth 2 (two) times daily.          BP 170/106  Pulse 80  Temp(Src) 95.2 F (35.1 C) (Rectal)  Resp 15  Ht 5\' 8"  (1.727 m)  Wt 156 lb (70.761 kg)  BMI 23.73 kg/m2  SpO2 99% Physical Exam  Constitutional: He appears well-developed and well-nourished.  Patient eyes closed. He will moan and grimace and states some incomprehensible words to painful stimuli.  Eyes: Conjunctivae are normal. Pupils are equal, round, and reactive to light.  Neck: Normal range of motion. Neck supple.  Cardiovascular: Normal rate, regular rhythm and normal heart sounds.   No murmur heard. Pulmonary/Chest: Effort normal and breath sounds normal.  Rhonchi present  Abdominal: Soft. He exhibits no distension. There is no tenderness.  Musculoskeletal:  No obvious deformity or swelling  Neurological:  Patient has some contractures of his right arm. He seems to have limited movement of his right side as compared to his left. I don't see any obvious facial drooping. He will respond to strong verbal or painful stimuli.  Skin: Skin is warm and dry.    ED Course  Procedures (including critical care time) Labs Review Results for orders placed during the hospital encounter of 10/30/13  ETHANOL      Result Value Range   Alcohol, Ethyl (B) <11  0 - 11 mg/dL  PROTIME-INR      Result  Value Range   Prothrombin Time 12.8  11.6 - 15.2 seconds   INR 0.98  0.00 - 1.49  APTT      Result Value Range   aPTT 33  24 - 37 seconds  CBC      Result Value Range   WBC 10.2  4.0 - 10.5 K/uL   RBC 4.50  4.22 - 5.81 MIL/uL   Hemoglobin 14.0  13.0 - 17.0 g/dL   HCT 65.7  84.6 - 96.2 %   MCV 91.3  78.0 - 100.0 fL   MCH 31.1  26.0 - 34.0 pg   MCHC 34.1  30.0 - 36.0 g/dL   RDW 95.2  84.1 - 32.4 %   Platelets 234  150 - 400 K/uL  DIFFERENTIAL      Result Value Range   Neutrophils Relative % 69  43 - 77 %   Neutro Abs 7.0  1.7 - 7.7 K/uL   Lymphocytes Relative 22  12 - 46 %   Lymphs Abs 2.2  0.7 - 4.0 K/uL   Monocytes Relative 7  3 - 12 %   Monocytes Absolute 0.7  0.1 - 1.0 K/uL   Eosinophils Relative 2  0 - 5 %   Eosinophils Absolute 0.2  0.0 - 0.7 K/uL   Basophils Relative 1  0 - 1 %   Basophils Absolute 0.1  0.0 - 0.1 K/uL  COMPREHENSIVE METABOLIC PANEL      Result Value Range   Sodium 140  137 - 147 mEq/L   Potassium 3.8  3.7 - 5.3 mEq/L   Chloride 103  96 - 112 mEq/L   CO2 24  19 - 32 mEq/L   Glucose, Bld 155 (*) 70 - 99 mg/dL   BUN 15  6 - 23 mg/dL   Creatinine, Ser 4.01  0.50 - 1.35 mg/dL   Calcium 9.0  8.4 - 02.7 mg/dL   Total Protein 7.2  6.0 - 8.3 g/dL   Albumin 3.9  3.5 - 5.2 g/dL   AST 12  0 - 37 U/L   ALT 13  0 - 53 U/L   Alkaline Phosphatase 52  39 - 117 U/L   Total Bilirubin 0.3  0.3 - 1.2 mg/dL   GFR calc non Af Amer >90  >90 mL/min   GFR calc Af Amer >90  >90 mL/min  TROPONIN I      Result Value Range   Troponin I <0.30  <0.30 ng/mL  URINE RAPID DRUG SCREEN (HOSP PERFORMED)      Result Value Range   Opiates NONE DETECTED  NONE DETECTED   Cocaine NONE DETECTED  NONE DETECTED   Benzodiazepines NONE DETECTED  NONE DETECTED   Amphetamines NONE DETECTED  NONE DETECTED   Tetrahydrocannabinol NONE DETECTED  NONE DETECTED   Barbiturates NONE DETECTED  NONE DETECTED  URINALYSIS, ROUTINE W REFLEX MICROSCOPIC      Result Value Range   Color, Urine YELLOW   YELLOW   APPearance CLEAR  CLEAR   Specific Gravity, Urine 1.023  1.005 - 1.030   pH 5.5  5.0 - 8.0   Glucose, UA NEGATIVE  NEGATIVE mg/dL   Hgb urine dipstick NEGATIVE  NEGATIVE   Bilirubin Urine NEGATIVE  NEGATIVE   Ketones, ur NEGATIVE  NEGATIVE mg/dL   Protein, ur NEGATIVE  NEGATIVE mg/dL   Urobilinogen, UA 0.2  0.0 - 1.0 mg/dL   Nitrite NEGATIVE  NEGATIVE   Leukocytes, UA NEGATIVE  NEGATIVE  GLUCOSE, CAPILLARY      Result Value Range   Glucose-Capillary 143 (*) 70 - 99 mg/dL  POCT I-STAT, CHEM 8      Result Value Range   Sodium 142  137 - 147 mEq/L   Potassium 3.7  3.7 - 5.3 mEq/L   Chloride 104  96 - 112 mEq/L   BUN 16  6 - 23 mg/dL   Creatinine, Ser 1.61  0.50 - 1.35 mg/dL   Glucose, Bld 096 (*) 70 - 99 mg/dL   Calcium, Ion 0.45  4.09 - 1.30 mmol/L   TCO2 24  0 - 100 mmol/L   Hemoglobin 14.6  13.0 - 17.0 g/dL   HCT 81.1  91.4 - 78.2 %  POCT I-STAT TROPONIN I      Result Value Range   Troponin i, poc 0.01  0.00 - 0.08 ng/mL   Comment 3           CG4 I-STAT (LACTIC ACID)      Result Value Range   Lactic Acid, Venous 2.17  0.5 - 2.2 mmol/L   Ct Head Wo Contrast  10/30/2013   CLINICAL DATA:  Mental status change with acute episode of unresponsiveness. Code stroke. History of stroke.  EXAM: CT HEAD WITHOUT CONTRAST  TECHNIQUE: Contiguous axial images were obtained from the base of the skull through the vertex without intravenous contrast.  COMPARISON:  None.  FINDINGS: There is right parietal encephalomalacia consistent with an old right MCA stroke. Atrophy and periventricular white matter disease are noted. There is no evidence of acute intracranial hemorrhage, mass lesion, brain edema or extra-axial fluid collection. There is no hydrocephalus.  The visualized paranasal sinuses, mastoid air cells and middle ears are clear. The calvarium is intact.  IMPRESSION: 1. No evidence of acute intracranial hemorrhage or acute stroke. 2. Old right MCA distribution infarct with atrophy and  chronic small vessel ischemic changes. 3. These results were called by telephone at the time of interpretation on 10/30/2013 at 11:00 AM to Dr. Shawna Orleans Thaddeaus Monica , who verbally acknowledged these results.   Electronically Signed   By: Roxy Horseman M.D.   On: 10/30/2013 11:00   Mr Maxine Glenn Head Wo Contrast  10/30/2013   CLINICAL DATA:  Acute onset of unresponsiveness.  EXAM: MRI HEAD WITHOUT CONTRAST  MRA HEAD WITHOUT CONTRAST  TECHNIQUE: Multiplanar, multiecho pulse sequences of the brain and surrounding  structures were obtained without intravenous contrast. Angiographic images of the head were obtained using MRA technique without contrast.  COMPARISON:  Head CT earlier the same day  FINDINGS: MRI HEAD FINDINGS  There is no evidence of acute infarct. Right frontoparietal encephalomalacia is consistent with remote MCA infarct. Encephalomalacia/atrophy is noted involving the left cerebral peduncle. Small, old cerebellar infarcts are noted, right greater than left. Periventricular white matter T2 hyperintensities are compatible with moderate chronic small vessel ischemic disease. There is moderate cerebral atrophy, mildly advanced for age. Remote microhemorrhages are noted in the right greater than left cerebellar hemispheres as well as left basal ganglia. There is no evidence of mass, midline shift, or extra-axial fluid collection. Orbits are unremarkable. Major intracranial vascular flow voids are unremarkable. Visualized paranasal sinuses and mastoid air cells are clear. Calvarium is normal in signal.  MRA HEAD FINDINGS  Images are mildly degraded by motion artifact. Visualized distal left vertebral artery is patent and dominant, demonstrating mild diffuse irregularity. The distal right vertebral artery is not well identified. Basilar artery is patent with mild irregularity but no evidence of high-grade stenosis. Left PICA appears patent, although its origin is not well evaluated. SCAs are patent bilaterally. Right PCA  origin is patent. Left P1 segment appears either occluded or congenitally hypoplastic/aplastic. There is a small left posterior communicating artery. Minimal, intermittent flow is identified in left PCA branches more distally. There is mild to moderate irregularity of the right PCA.  Internal carotid arteries are patent from skullbase to carotid terminus. The left intracranial internal carotid artery appears mildly dominant compared to the right. There is mild, diffuse narrowing and irregularity of the left carotid siphon and supraclinoid left ICA. The right A1 segment is markedly hypoplastic. Right A2 segment is predominantly supplied via the anterior communicating artery. The left ACA is unremarkable. MCA origins are patent bilaterally. Visualized left MCA branches are patent with mild irregularity. Right MCA trifurcation is patent, however there is occlusion of small distal branches likely related to prior infarct. There also a few areas of apparent moderate focal stenosis involving proximal M2 branches on the right. No intracranial aneurysm is identified.  IMPRESSION: 1. No evidence of acute infarct or other acute intracranial abnormality. 2. Remote right MCA and bilateral cerebellar infarcts. 3. Mild to moderate irregularity and narrowing of right greater than left MCA branches consistent with atherosclerosis. 4. Occlusion of distal right MCA branches, likely related to prior infarct. 5. Mild to moderate posterior circulation irregularity, consistent with atherosclerosis. Left P1 segment appears either occluded or congenitally aplastic, with minimal flow in more distal left PCA branches. 6. 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   Electronically Signed   By: Sebastian Ache   On: 10/30/2013 12:30   Mr Brain Wo Contrast  10/30/2013   CLINICAL DATA:  Acute onset of unresponsiveness.  EXAM: MRI HEAD WITHOUT CONTRAST   MRA HEAD WITHOUT CONTRAST  TECHNIQUE: Multiplanar, multiecho pulse sequences of the brain and surrounding structures were obtained without intravenous contrast. Angiographic images of the head were obtained using MRA technique without contrast.  COMPARISON:  Head CT earlier the same day  FINDINGS: MRI HEAD FINDINGS  There is no evidence of acute infarct. Right frontoparietal encephalomalacia is consistent with remote MCA infarct. Encephalomalacia/atrophy is noted involving the left cerebral peduncle. Small, old cerebellar infarcts are noted, right greater than left. Periventricular white matter T2 hyperintensities are compatible with moderate chronic small vessel ischemic disease. There is  moderate cerebral atrophy, mildly advanced for age. Remote microhemorrhages are noted in the right greater than left cerebellar hemispheres as well as left basal ganglia. There is no evidence of mass, midline shift, or extra-axial fluid collection. Orbits are unremarkable. Major intracranial vascular flow voids are unremarkable. Visualized paranasal sinuses and mastoid air cells are clear. Calvarium is normal in signal.  MRA HEAD FINDINGS  Images are mildly degraded by motion artifact. Visualized distal left vertebral artery is patent and dominant, demonstrating mild diffuse irregularity. The distal right vertebral artery is not well identified. Basilar artery is patent with mild irregularity but no evidence of high-grade stenosis. Left PICA appears patent, although its origin is not well evaluated. SCAs are patent bilaterally. Right PCA origin is patent. Left P1 segment appears either occluded or congenitally hypoplastic/aplastic. There is a small left posterior communicating artery. Minimal, intermittent flow is identified in left PCA branches more distally. There is mild to moderate irregularity of the right PCA.  Internal carotid arteries are patent from skullbase to carotid terminus. The left intracranial internal carotid  artery appears mildly dominant compared to the right. There is mild, diffuse narrowing and irregularity of the left carotid siphon and supraclinoid left ICA. The right A1 segment is markedly hypoplastic. Right A2 segment is predominantly supplied via the anterior communicating artery. The left ACA is unremarkable. MCA origins are patent bilaterally. Visualized left MCA branches are patent with mild irregularity. Right MCA trifurcation is patent, however there is occlusion of small distal branches likely related to prior infarct. There also a few areas of apparent moderate focal stenosis involving proximal M2 branches on the right. No intracranial aneurysm is identified.  IMPRESSION: 1. No evidence of acute infarct or other acute intracranial abnormality. 2. Remote right MCA and bilateral cerebellar infarcts. 3. Mild to moderate irregularity and narrowing of right greater than left MCA branches consistent with atherosclerosis. 4. Occlusion of distal right MCA branches, likely related to prior infarct. 5. Mild to moderate posterior circulation irregularity, consistent with atherosclerosis. Left P1 segment appears either occluded or congenitally aplastic, with minimal flow in more distal left PCA branches. 6. 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   Electronically Signed   By: Sebastian Ache   On: 10/30/2013 12:30   Dg Chest Port 1 View  10/30/2013   CLINICAL DATA:  Shortness of breath.  Altered mental status  EXAM: PORTABLE CHEST - 1 VIEW  COMPARISON:  None  FINDINGS: Difficult positioning required holding the patient during imaging. The costophrenic angles were excluded.  Old right rib deformity from prior fracture. The lungs appear clear. Cardiac and mediastinal margins appear normal.  IMPRESSION: 1. No acute thoracic findings.   Electronically Signed   By: Herbie Baltimore M.D.   On: 10/30/2013 12:58     Imaging  Review Ct Head Wo Contrast  10/30/2013   CLINICAL DATA:  Mental status change with acute episode of unresponsiveness. Code stroke. History of stroke.  EXAM: CT HEAD WITHOUT CONTRAST  TECHNIQUE: Contiguous axial images were obtained from the base of the skull through the vertex without intravenous contrast.  COMPARISON:  None.  FINDINGS: There is right parietal encephalomalacia consistent with an old right MCA stroke. Atrophy and periventricular white matter disease are noted. There is no evidence of acute intracranial hemorrhage, mass lesion, brain edema or extra-axial fluid collection. There is no hydrocephalus.  The visualized paranasal sinuses, mastoid air cells and middle ears are clear. The calvarium is  intact.  IMPRESSION: 1. No evidence of acute intracranial hemorrhage or acute stroke. 2. Old right MCA distribution infarct with atrophy and chronic small vessel ischemic changes. 3. These results were called by telephone at the time of interpretation on 10/30/2013 at 11:00 AM to Dr. Shawna OrleansMELANIE Findlay Dagher , who verbally acknowledged these results.   Electronically Signed   By: Roxy HorsemanBill  Veazey M.D.   On: 10/30/2013 11:00   Mr Maxine GlennMra Head Wo Contrast  10/30/2013   CLINICAL DATA:  Acute onset of unresponsiveness.  EXAM: MRI HEAD WITHOUT CONTRAST  MRA HEAD WITHOUT CONTRAST  TECHNIQUE: Multiplanar, multiecho pulse sequences of the brain and surrounding structures were obtained without intravenous contrast. Angiographic images of the head were obtained using MRA technique without contrast.  COMPARISON:  Head CT earlier the same day  FINDINGS: MRI HEAD FINDINGS  There is no evidence of acute infarct. Right frontoparietal encephalomalacia is consistent with remote MCA infarct. Encephalomalacia/atrophy is noted involving the left cerebral peduncle. Small, old cerebellar infarcts are noted, right greater than left. Periventricular white matter T2 hyperintensities are compatible with moderate chronic small vessel ischemic disease.  There is moderate cerebral atrophy, mildly advanced for age. Remote microhemorrhages are noted in the right greater than left cerebellar hemispheres as well as left basal ganglia. There is no evidence of mass, midline shift, or extra-axial fluid collection. Orbits are unremarkable. Major intracranial vascular flow voids are unremarkable. Visualized paranasal sinuses and mastoid air cells are clear. Calvarium is normal in signal.  MRA HEAD FINDINGS  Images are mildly degraded by motion artifact. Visualized distal left vertebral artery is patent and dominant, demonstrating mild diffuse irregularity. The distal right vertebral artery is not well identified. Basilar artery is patent with mild irregularity but no evidence of high-grade stenosis. Left PICA appears patent, although its origin is not well evaluated. SCAs are patent bilaterally. Right PCA origin is patent. Left P1 segment appears either occluded or congenitally hypoplastic/aplastic. There is a small left posterior communicating artery. Minimal, intermittent flow is identified in left PCA branches more distally. There is mild to moderate irregularity of the right PCA.  Internal carotid arteries are patent from skullbase to carotid terminus. The left intracranial internal carotid artery appears mildly dominant compared to the right. There is mild, diffuse narrowing and irregularity of the left carotid siphon and supraclinoid left ICA. The right A1 segment is markedly hypoplastic. Right A2 segment is predominantly supplied via the anterior communicating artery. The left ACA is unremarkable. MCA origins are patent bilaterally. Visualized left MCA branches are patent with mild irregularity. Right MCA trifurcation is patent, however there is occlusion of small distal branches likely related to prior infarct. There also a few areas of apparent moderate focal stenosis involving proximal M2 branches on the right. No intracranial aneurysm is identified.  IMPRESSION:  1. No evidence of acute infarct or other acute intracranial abnormality. 2. Remote right MCA and bilateral cerebellar infarcts. 3. Mild to moderate irregularity and narrowing of right greater than left MCA branches consistent with atherosclerosis. 4. Occlusion of distal right MCA branches, likely related to prior infarct. 5. Mild to moderate posterior circulation irregularity, consistent with atherosclerosis. Left P1 segment appears either occluded or congenitally aplastic, with minimal flow in more distal left PCA branches. 6. 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   Electronically Signed   By: Sebastian AcheAllen  Grady   On: 10/30/2013 12:30   Mr Brain Wo Contrast  10/30/2013   CLINICAL DATA:  Acute onset of unresponsiveness.  EXAM: MRI HEAD WITHOUT CONTRAST  MRA HEAD WITHOUT CONTRAST  TECHNIQUE: Multiplanar, multiecho pulse sequences of the brain and surrounding structures were obtained without intravenous contrast. Angiographic images of the head were obtained using MRA technique without contrast.  COMPARISON:  Head CT earlier the same day  FINDINGS: MRI HEAD FINDINGS  There is no evidence of acute infarct. Right frontoparietal encephalomalacia is consistent with remote MCA infarct. Encephalomalacia/atrophy is noted involving the left cerebral peduncle. Small, old cerebellar infarcts are noted, right greater than left. Periventricular white matter T2 hyperintensities are compatible with moderate chronic small vessel ischemic disease. There is moderate cerebral atrophy, mildly advanced for age. Remote microhemorrhages are noted in the right greater than left cerebellar hemispheres as well as left basal ganglia. There is no evidence of mass, midline shift, or extra-axial fluid collection. Orbits are unremarkable. Major intracranial vascular flow voids are unremarkable. Visualized paranasal sinuses and mastoid air cells are  clear. Calvarium is normal in signal.  MRA HEAD FINDINGS  Images are mildly degraded by motion artifact. Visualized distal left vertebral artery is patent and dominant, demonstrating mild diffuse irregularity. The distal right vertebral artery is not well identified. Basilar artery is patent with mild irregularity but no evidence of high-grade stenosis. Left PICA appears patent, although its origin is not well evaluated. SCAs are patent bilaterally. Right PCA origin is patent. Left P1 segment appears either occluded or congenitally hypoplastic/aplastic. There is a small left posterior communicating artery. Minimal, intermittent flow is identified in left PCA branches more distally. There is mild to moderate irregularity of the right PCA.  Internal carotid arteries are patent from skullbase to carotid terminus. The left intracranial internal carotid artery appears mildly dominant compared to the right. There is mild, diffuse narrowing and irregularity of the left carotid siphon and supraclinoid left ICA. The right A1 segment is markedly hypoplastic. Right A2 segment is predominantly supplied via the anterior communicating artery. The left ACA is unremarkable. MCA origins are patent bilaterally. Visualized left MCA branches are patent with mild irregularity. Right MCA trifurcation is patent, however there is occlusion of small distal branches likely related to prior infarct. There also a few areas of apparent moderate focal stenosis involving proximal M2 branches on the right. No intracranial aneurysm is identified.  IMPRESSION: 1. No evidence of acute infarct or other acute intracranial abnormality. 2. Remote right MCA and bilateral cerebellar infarcts. 3. Mild to moderate irregularity and narrowing of right greater than left MCA branches consistent with atherosclerosis. 4. Occlusion of distal right MCA branches, likely related to prior infarct. 5. Mild to moderate posterior circulation irregularity, consistent with  atherosclerosis. Left P1 segment appears either occluded or congenitally aplastic, with minimal flow in more distal left PCA branches. 6. 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   Electronically Signed   By: Sebastian Ache   On: 10/30/2013 12:30   Dg Chest Port 1 View  10/30/2013   CLINICAL DATA:  Shortness of breath.  Altered mental status  EXAM: PORTABLE CHEST - 1 VIEW  COMPARISON:  None  FINDINGS: Difficult positioning required holding the patient during imaging. The costophrenic angles were excluded.  Old right rib deformity from prior fracture. The lungs appear clear. Cardiac and mediastinal margins appear normal.  IMPRESSION: 1. No acute thoracic findings.   Electronically Signed   By: Herbie Baltimore M.D.   On: 10/30/2013 12:58    EKG Interpretation  Date/Time:  Sunday October 30 2013 10:43:40 EST Ventricular Rate:  74 PR Interval:  166 QRS Duration: 104 QT Interval:  419 QTC Calculation: 465 R Axis:   -95 Text Interpretation:  Sinus rhythm Right superior axis No old tracing to compare Confirmed by Abdulhamid Olgin  MD, Jakiera Ehler ([id number]) on 10/30/2013 2:31:32 PM            MDM   1. Altered mental status     12 :32 Pt has been in MRI, awaiting CXR.  More alert than on arrival.  Controlling airway.  Noted to be hypothermic.  Will check lactate, start warming blankets  14:30 lactate is normal. Chest x-ray does not show pneumonia. Patient is much more alert and communicative at this point. There is no other suggestions of sepsis. Seizure is a possibility. I consulted with the hospitalist to admit the patient for observation.  Rolan Bucco, MD 10/30/13 1431  Rolan Bucco, MD 10/30/13 [phone]

## 2013-10-30 NOTE — ED Notes (Signed)
Per Shanda BumpsJessica RN NH stated pt is normally confused and disoriented to his surroundings but alert to self, they noted a Right side facial droop with drooling from Right side of mouth.

## 2013-10-30 NOTE — ED Notes (Signed)
MD Camilla with neuro at bedside.

## 2013-10-31 ENCOUNTER — Inpatient Hospital Stay (HOSPITAL_COMMUNITY): Payer: Medicare Other

## 2013-10-31 DIAGNOSIS — R509 Fever, unspecified: Secondary | ICD-10-CM | POA: Diagnosis present

## 2013-10-31 LAB — GLUCOSE, CAPILLARY
GLUCOSE-CAPILLARY: 161 mg/dL — AB (ref 70–99)
GLUCOSE-CAPILLARY: 162 mg/dL — AB (ref 70–99)
GLUCOSE-CAPILLARY: 95 mg/dL (ref 70–99)
Glucose-Capillary: 135 mg/dL — ABNORMAL HIGH (ref 70–99)
Glucose-Capillary: 154 mg/dL — ABNORMAL HIGH (ref 70–99)
Glucose-Capillary: 129 mg/dL — ABNORMAL HIGH (ref 70–99)

## 2013-10-31 LAB — HEMOGLOBIN A1C
Hgb A1c MFr Bld: 6.3 % — ABNORMAL HIGH (ref <5.7)
MEAN PLASMA GLUCOSE: 134 mg/dL — AB (ref <117)

## 2013-10-31 LAB — URINE CULTURE
COLONY COUNT: NO GROWTH
CULTURE: NO GROWTH

## 2013-10-31 LAB — INFLUENZA PANEL BY PCR (TYPE A & B)
H1N1 flu by pcr: NOT DETECTED
Influenza A By PCR: NEGATIVE
Influenza B By PCR: NEGATIVE

## 2013-10-31 LAB — MRSA PCR SCREENING: MRSA BY PCR: POSITIVE — AB

## 2013-10-31 LAB — TSH: TSH: 1.641 u[IU]/mL (ref 0.350–4.500)

## 2013-10-31 MED ORDER — VANCOMYCIN HCL 10 G IV SOLR
1250.0000 mg | Freq: Once | INTRAVENOUS | Status: AC
  Administered 2013-10-31: 1250 mg via INTRAVENOUS
  Filled 2013-10-31: qty 1250

## 2013-10-31 MED ORDER — SODIUM CHLORIDE 0.9 % IV SOLN
1000.0000 mg | Freq: Once | INTRAVENOUS | Status: AC
  Administered 2013-10-31: 1000 mg via INTRAVENOUS
  Filled 2013-10-31: qty 10

## 2013-10-31 MED ORDER — PIPERACILLIN-TAZOBACTAM 3.375 G IVPB
3.3750 g | Freq: Three times a day (TID) | INTRAVENOUS | Status: DC
  Administered 2013-10-31 – 2013-11-03 (×9): 3.375 g via INTRAVENOUS
  Filled 2013-10-31 (×11): qty 50

## 2013-10-31 MED ORDER — GLUCERNA SHAKE PO LIQD
237.0000 mL | ORAL | Status: DC
  Administered 2013-11-01 – 2013-11-03 (×2): 237 mL via ORAL

## 2013-10-31 MED ORDER — MUPIROCIN 2 % EX OINT
1.0000 "application " | TOPICAL_OINTMENT | Freq: Two times a day (BID) | CUTANEOUS | Status: DC
  Administered 2013-10-31 – 2013-11-03 (×7): 1 via NASAL
  Filled 2013-10-31 (×2): qty 22

## 2013-10-31 MED ORDER — CHLORHEXIDINE GLUCONATE CLOTH 2 % EX PADS
6.0000 | MEDICATED_PAD | Freq: Every day | CUTANEOUS | Status: DC
  Administered 2013-10-31 – 2013-11-03 (×3): 6 via TOPICAL

## 2013-10-31 MED ORDER — SODIUM CHLORIDE 0.9 % IV BOLUS (SEPSIS)
1000.0000 mL | INTRAVENOUS | Status: DC | PRN
  Administered 2013-10-31: 500 mL via INTRAVENOUS
  Administered 2013-10-31: 1000 mL via INTRAVENOUS

## 2013-10-31 MED ORDER — LEVETIRACETAM 500 MG PO TABS
500.0000 mg | ORAL_TABLET | Freq: Two times a day (BID) | ORAL | Status: DC
  Administered 2013-11-01 – 2013-11-03 (×5): 500 mg via ORAL
  Filled 2013-10-31 (×6): qty 1

## 2013-10-31 MED ORDER — SODIUM CHLORIDE 0.9 % IV BOLUS (SEPSIS)
1500.0000 mL | Freq: Once | INTRAVENOUS | Status: AC
  Administered 2013-10-31: 1500 mL via INTRAVENOUS

## 2013-10-31 MED ORDER — VANCOMYCIN HCL IN DEXTROSE 750-5 MG/150ML-% IV SOLN
750.0000 mg | Freq: Three times a day (TID) | INTRAVENOUS | Status: DC
  Administered 2013-11-01 – 2013-11-03 (×7): 750 mg via INTRAVENOUS
  Filled 2013-10-31 (×8): qty 150

## 2013-10-31 MED ORDER — SODIUM CHLORIDE 0.9 % IV SOLN
INTRAVENOUS | Status: DC
  Administered 2013-11-02 (×2): via INTRAVENOUS

## 2013-10-31 MED ORDER — SODIUM CHLORIDE 0.9 % IV BOLUS (SEPSIS)
1000.0000 mL | Freq: Once | INTRAVENOUS | Status: AC
  Administered 2013-10-31: 1000 mL via INTRAVENOUS

## 2013-10-31 NOTE — Progress Notes (Signed)
INITIAL NUTRITION ASSESSMENT  DOCUMENTATION CODES Per approved criteria  -Not Applicable   INTERVENTION: Add Glucerna Shake po daily, each supplement provides 220 kcal and 10 grams of protein. RD to continue to follow nutrition care plan.  NUTRITION DIAGNOSIS: Inadequate oral intake related to AMS as evidenced by poor meal completion.   Goal: Intake to meet >90% of estimated nutrition needs.  Monitor:  weight trends, lab trends, I/O's, PO intake, supplement tolerance  Reason for Assessment: Low Braden Score  68 y.o. male  Admitting Dx: Altered mental status  ASSESSMENT: From [facility]. PMHx significant for HTN, DM, HLD, CVA with residual R hemiparesis, substance abuse. Admitted with AMS. Work-up ongoing.   MRI of head did not show acute stroke. No s/s of infection presently. EEG completed 1/5 - findings reveal abnormal EEG suggesting of mild encephalopathy.  Currently ordered for a Low Sodium diet. Ate 30% for breakfast. RD attempted to awake patient for nutrition hx, however pt lethargic and continued sleeping. Pt without signs of severe fat/muscle wasting present. RD reviewed MAR from SNF, pt did not have any oral nutrition supplements ordered PTA.   Height: Ht Readings from Last 1 Encounters:  10/30/13 5\' 8"  (1.727 m)    Weight: Wt Readings from Last 1 Encounters:  10/30/13 153 lb (69.4 kg)    Ideal Body Weight: 154 lb  % Ideal Body Weight: 100%  Wt Readings from Last 10 Encounters:  10/30/13 153 lb (69.4 kg)    Usual Body Weight: n/a  % Usual Body Weight: n/a  BMI:  Body mass index is 23.27 kg/(m^2). Normal weight  Estimated Nutritional Needs: Kcal: 1550 - 1700 Protein: 70 - 80 g Fluid: 1.5 - 1.8 liters  Skin: intact  Diet Order: Sodium Restricted  EDUCATION NEEDS: -No education needs identified at this time   Intake/Output Summary (Last 24 hours) at 10/31/13 1442 Last data filed at 10/31/13 0808  Gross per 24 hour  Intake    870 ml   Output    200 ml  Net    670 ml    Last BM:  PTA  Labs:   Recent Labs Lab 10/30/13 1041 10/30/13 1045  NA 140 142  K 3.8 3.7  CL 103 104  CO2 24  --   BUN 15 16  CREATININE 0.63 0.70  CALCIUM 9.0  --   GLUCOSE 155* 161*    CBG (last 3)   Recent Labs  10/31/13 0435 10/31/13 0818 10/31/13 1120  GLUCAP 161* 135* 154*    Scheduled Meds: . Chlorhexidine Gluconate Cloth  6 each Topical Q0600  . clopidogrel  75 mg Oral Q breakfast  . docusate  100 mg Oral BID  . enoxaparin (LOVENOX) injection  40 mg Subcutaneous Q24H  . folic acid  1 mg Oral Daily  . gabapentin  200 mg Oral q morning - 10a  . insulin aspart  0-9 Units Subcutaneous TID WC  . [START ON 11/01/2013] levETIRAcetam  500 mg Oral BID  . lisinopril  20 mg Oral Daily  . mupirocin ointment  1 application Nasal BID  . polyethylene glycol  17 g Oral Daily  . simvastatin  10 mg Oral q1800  . sodium chloride  3 mL Intravenous Q12H    Continuous Infusions:   Past Medical History  Diagnosis Date  . Hyperlipemia   . Diabetes   . Hemiplegia   . Vertebral artery occlusion   . Substance abuse   . Hypertension   . Cerebral artery occlusion  with cerebral infarction 08/22/2011    History reviewed. No pertinent past surgical history.  Jarold MottoSamantha Kijana Cromie MS, RD, LDN Pager: [phone] After-hours pager: [phone]

## 2013-10-31 NOTE — Progress Notes (Signed)
NEURO HOSPITALIST PROGRESS NOTE   SUBJECTIVE:                                                                                                                        Seems to be more alert this morning. Open eyes to verbal commands but is not able to interact and follow commands consistently. MRI brain showed no acute abnormality. Remote right MCA distribution infarct. MRA brain: mild to moderate irregularity and narrowing of right greater than left MCA branches consistent with atherosclerosis. Occlusion of distal right MCA branches. Mild to moderate posterior circulation irregularity, consistent  with atherosclerosis. UDS negative. Ethanol <11 Serologies unimpressive. UA negative.  OBJECTIVE:                                                                                                                           Vital signs in last 24 hours: Temp:  [95.2 F (35.1 C)-101.2 F (38.4 C)] 99.5 F (37.5 C) (01/05 0800) Pulse Rate:  [66-117] 94 (01/05 0808) Resp:  [11-27] 24 (01/05 0808) BP: (102-170)/(59-118) 116/71 mmHg (01/05 0808) SpO2:  [91 %-100 %] 96 % (01/05 0808) FiO2 (%):  [21 %] 21 % (01/04 1040) Weight:  [69.4 kg (153 lb)-70.761 kg (156 lb)] 69.4 kg (153 lb) (01/04 1935)  Intake/Output from previous day: 01/04 0701 - 01/05 0700 In: 525 [I.V.:525] Out: 200 [Urine:200] Intake/Output this shift: Total I/O In: 225 [I.V.:225] Out: -  Nutritional status: Sodium Restricted  Past Medical History  Diagnosis Date  . Hyperlipemia   . Diabetes   . Hemiplegia   . Vertebral artery occlusion   . Substance abuse   . Hypertension   . Cerebral artery occlusion with cerebral infarction 08/22/2011    Neurologic Exam:  Mental status: open eyes to verbal commands but for the most part drowsy and unable to follow commands. CN 2-12: pupils 4 mm bilaterally, reactive to light. No gaze preference. EOM full without nystagmus. Face symmetric. Tongue  midline.  Motor: old right HP.  Left sided weakness, leg greater than arm.  Sensory: reacts to pain and tries to localize DTRs: 2 all over.  Plantars: right upgoing, left downgoing.  Coordination and gait: unable to test.  No meningeal signs   Lab  Results: No results found for this basename: cbc, bmp, coags, chol, tri, ldl, hga1c   Lipid Panel No results found for this basename: CHOL, TRIG, HDL, CHOLHDL, VLDL, LDLCALC,  in the last 72 hours  Studies/Results: Ct Head Wo Contrast  10/30/2013   CLINICAL DATA:  Mental status change with acute episode of unresponsiveness. Code stroke. History of stroke.  EXAM: CT HEAD WITHOUT CONTRAST  TECHNIQUE: Contiguous axial images were obtained from the base of the skull through the vertex without intravenous contrast.  COMPARISON:  None.  FINDINGS: There is right parietal encephalomalacia consistent with an old right MCA stroke. Atrophy and periventricular white matter disease are noted. There is no evidence of acute intracranial hemorrhage, mass lesion, brain edema or extra-axial fluid collection. There is no hydrocephalus.  The visualized paranasal sinuses, mastoid air cells and middle ears are clear. The calvarium is intact.  IMPRESSION: 1. No evidence of acute intracranial hemorrhage or acute stroke. 2. Old right MCA distribution infarct with atrophy and chronic small vessel ischemic changes. 3. These results were called by telephone at the time of interpretation on 10/30/2013 at 11:00 AM to Dr. Shawna Orleans BELFI , who verbally acknowledged these results.   Electronically Signed   By: Roxy Horseman M.D.   On: 10/30/2013 11:00   Mr Maxine Glenn Head Wo Contrast  10/30/2013   CLINICAL DATA:  Acute onset of unresponsiveness.  EXAM: MRI HEAD WITHOUT CONTRAST  MRA HEAD WITHOUT CONTRAST  TECHNIQUE: Multiplanar, multiecho pulse sequences of the brain and surrounding structures were obtained without intravenous contrast. Angiographic images of the head were obtained using MRA  technique without contrast.  COMPARISON:  Head CT earlier the same day  FINDINGS: MRI HEAD FINDINGS  There is no evidence of acute infarct. Right frontoparietal encephalomalacia is consistent with remote MCA infarct. Encephalomalacia/atrophy is noted involving the left cerebral peduncle. Small, old cerebellar infarcts are noted, right greater than left. Periventricular white matter T2 hyperintensities are compatible with moderate chronic small vessel ischemic disease. There is moderate cerebral atrophy, mildly advanced for age. Remote microhemorrhages are noted in the right greater than left cerebellar hemispheres as well as left basal ganglia. There is no evidence of mass, midline shift, or extra-axial fluid collection. Orbits are unremarkable. Major intracranial vascular flow voids are unremarkable. Visualized paranasal sinuses and mastoid air cells are clear. Calvarium is normal in signal.  MRA HEAD FINDINGS  Images are mildly degraded by motion artifact. Visualized distal left vertebral artery is patent and dominant, demonstrating mild diffuse irregularity. The distal right vertebral artery is not well identified. Basilar artery is patent with mild irregularity but no evidence of high-grade stenosis. Left PICA appears patent, although its origin is not well evaluated. SCAs are patent bilaterally. Right PCA origin is patent. Left P1 segment appears either occluded or congenitally hypoplastic/aplastic. There is a small left posterior communicating artery. Minimal, intermittent flow is identified in left PCA branches more distally. There is mild to moderate irregularity of the right PCA.  Internal carotid arteries are patent from skullbase to carotid terminus. The left intracranial internal carotid artery appears mildly dominant compared to the right. There is mild, diffuse narrowing and irregularity of the left carotid siphon and supraclinoid left ICA. The right A1 segment is markedly hypoplastic. Right A2 segment  is predominantly supplied via the anterior communicating artery. The left ACA is unremarkable. MCA origins are patent bilaterally. Visualized left MCA branches are patent with mild irregularity. Right MCA trifurcation is patent, however there is occlusion of small distal branches  likely related to prior infarct. There also a few areas of apparent moderate focal stenosis involving proximal M2 branches on the right. No intracranial aneurysm is identified.  IMPRESSION: 1. No evidence of acute infarct or other acute intracranial abnormality. 2. Remote right MCA and bilateral cerebellar infarcts. 3. Mild to moderate irregularity and narrowing of right greater than left MCA branches consistent with atherosclerosis. 4. Occlusion of distal right MCA branches, likely related to prior infarct. 5. Mild to moderate posterior circulation irregularity, consistent with atherosclerosis. Left P1 segment appears either occluded or congenitally aplastic, with minimal flow in more distal left PCA branches. 6. 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   Electronically Signed   By: Sebastian AcheAllen  Grady   On: 10/30/2013 12:30   Mr Brain Wo Contrast  10/30/2013   CLINICAL DATA:  Acute onset of unresponsiveness.  EXAM: MRI HEAD WITHOUT CONTRAST  MRA HEAD WITHOUT CONTRAST  TECHNIQUE: Multiplanar, multiecho pulse sequences of the brain and surrounding structures were obtained without intravenous contrast. Angiographic images of the head were obtained using MRA technique without contrast.  COMPARISON:  Head CT earlier the same day  FINDINGS: MRI HEAD FINDINGS  There is no evidence of acute infarct. Right frontoparietal encephalomalacia is consistent with remote MCA infarct. Encephalomalacia/atrophy is noted involving the left cerebral peduncle. Small, old cerebellar infarcts are noted, right greater than left. Periventricular white matter T2  hyperintensities are compatible with moderate chronic small vessel ischemic disease. There is moderate cerebral atrophy, mildly advanced for age. Remote microhemorrhages are noted in the right greater than left cerebellar hemispheres as well as left basal ganglia. There is no evidence of mass, midline shift, or extra-axial fluid collection. Orbits are unremarkable. Major intracranial vascular flow voids are unremarkable. Visualized paranasal sinuses and mastoid air cells are clear. Calvarium is normal in signal.  MRA HEAD FINDINGS  Images are mildly degraded by motion artifact. Visualized distal left vertebral artery is patent and dominant, demonstrating mild diffuse irregularity. The distal right vertebral artery is not well identified. Basilar artery is patent with mild irregularity but no evidence of high-grade stenosis. Left PICA appears patent, although its origin is not well evaluated. SCAs are patent bilaterally. Right PCA origin is patent. Left P1 segment appears either occluded or congenitally hypoplastic/aplastic. There is a small left posterior communicating artery. Minimal, intermittent flow is identified in left PCA branches more distally. There is mild to moderate irregularity of the right PCA.  Internal carotid arteries are patent from skullbase to carotid terminus. The left intracranial internal carotid artery appears mildly dominant compared to the right. There is mild, diffuse narrowing and irregularity of the left carotid siphon and supraclinoid left ICA. The right A1 segment is markedly hypoplastic. Right A2 segment is predominantly supplied via the anterior communicating artery. The left ACA is unremarkable. MCA origins are patent bilaterally. Visualized left MCA branches are patent with mild irregularity. Right MCA trifurcation is patent, however there is occlusion of small distal branches likely related to prior infarct. There also a few areas of apparent moderate focal stenosis involving  proximal M2 branches on the right. No intracranial aneurysm is identified.  IMPRESSION: 1. No evidence of acute infarct or other acute intracranial abnormality. 2. Remote right MCA and bilateral cerebellar infarcts. 3. Mild to moderate irregularity and narrowing of right greater than left MCA branches consistent with atherosclerosis. 4. Occlusion of distal right MCA branches, likely related to prior infarct. 5. Mild to moderate posterior circulation  irregularity, consistent with atherosclerosis. Left P1 segment appears either occluded or congenitally aplastic, with minimal flow in more distal left PCA branches. 6. 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   Electronically Signed   By: Sebastian Ache   On: 10/30/2013 12:30   Dg Chest Port 1 View  10/30/2013   CLINICAL DATA:  Shortness of breath.  Altered mental status  EXAM: PORTABLE CHEST - 1 VIEW  COMPARISON:  None  FINDINGS: Difficult positioning required holding the patient during imaging. The costophrenic angles were excluded.  Old right rib deformity from prior fracture. The lungs appear clear. Cardiac and mediastinal margins appear normal.  IMPRESSION: 1. No acute thoracic findings.   Electronically Signed   By: Herbie Baltimore M.D.   On: 10/30/2013 12:58    MEDICATIONS                                                                                                                       I have reviewed the patient's current medications.  ASSESSMENT/PLAN:                                                                                                            68 y.o. male with multiple risk factors for stroke, brought in due to altered mental status, and right face weakness with drooling. MRI brain showed no acute abnormality but old right MCA distribution infarct. Serologies, EDS, U/A, ETOH level unremarkable. I wonder if he had a seizure with prolonged  postictal state or NCSE. Will empirically load him with 1 gram IV keppra now and continue keppra 500 mg BID. Awaiting EEG. Will follow up.  Wyatt Portela, MD [facility] [phone]  10/31/2013, 9:32 AM

## 2013-10-31 NOTE — Progress Notes (Addendum)
Follow-up:  Returned to find pt now responding to voice.  After 3 l NS  117/75  HR 75 RR 22 with 99%O2sats on 2l [place]. Spoke with Elnita Maxwellheryl RN re same,  Pt has had no U/O since arrival to floor at 1800 hrs.  No urine recorded on flow sheet since 1300 hrs.  Bladder soft.  Continue to monitor u/o and BP.  0430   BP 136/80 HR 68, voiding.   Progressing well

## 2013-10-31 NOTE — Progress Notes (Signed)
Pt tx to 6E per MD order, pt VSS, pt's family notified of tx pt unstable to learn at present, report called to receiving RN, all questions answered, pt belongings sent to pr's room

## 2013-10-31 NOTE — Progress Notes (Signed)
ANTIBIOTIC CONSULT NOTE - INITIAL CONSULT Pharmacy Consult for Zosyn, Vancomycin Indication: Sepsix  No Known Allergies  Patient Measurements: Height: 5\' 8"  (172.7 cm) Weight: 153 lb (69.4 kg) IBW/kg (Calculated) : 68.4 Adjusted Body Weight:   Vital Signs: Temp: 98.2 F (36.8 C) (01/05 1603) Temp src: Oral (01/05 1128) BP: 62/48 mmHg (01/05 1648) Pulse Rate: 77 (01/05 1603) Intake/Output from previous day: 01/04 0701 - 01/05 0700 In: 525 [I.V.:525] Out: 200 [Urine:200] Intake/Output from this shift: Total I/O In: 345 [P.O.:120; I.V.:225] Out: -   Labs:  Recent Labs  10/30/13 1041 10/30/13 1045  WBC 10.2  --   HGB 14.0 14.6  PLT 234  --   CREATININE 0.63 0.70   Estimated Creatinine Clearance: 87.9 ml/min (by C-G formula based on Cr of 0.7). No results found for this basename: VANCOTROUGH, Leodis Binet, VANCORANDOM, GENTTROUGH, GENTPEAK, GENTRANDOM, TOBRATROUGH, TOBRAPEAK, TOBRARND, AMIKACINPEAK, AMIKACINTROU, AMIKACIN,  in the last 72 hours   Microbiology: Recent Results (from the past 720 hour(s))  CULTURE, BLOOD (ROUTINE X 2)     Status: None   Collection Time    10/30/13  2:00 PM      Result Value Range Status   Specimen Description BLOOD RIGHT HAND   Final   Special Requests BOTTLES DRAWN AEROBIC ONLY 8CC   Final   Culture  Setup Time     Final   Value: 10/30/2013 21:17     Performed at [facility]   Culture     Final   Value:        BLOOD CULTURE RECEIVED NO GROWTH TO DATE CULTURE WILL BE HELD FOR 5 DAYS BEFORE ISSUING A FINAL NEGATIVE REPORT     Performed at [facility]   Report Status PENDING   Incomplete  CULTURE, BLOOD (ROUTINE X 2)     Status: None   Collection Time    10/30/13  2:10 PM      Result Value Range Status   Specimen Description BLOOD LEFT ARM   Final   Special Requests BOTTLES DRAWN AEROBIC AND ANAEROBIC R 5CC B 10CC   Final   Culture  Setup Time     Final   Value: 10/30/2013 21:18     Performed at [facility]   Culture     Final   Value:        BLOOD CULTURE RECEIVED NO GROWTH TO DATE CULTURE WILL BE HELD FOR 5 DAYS BEFORE ISSUING A FINAL NEGATIVE REPORT     Performed at [facility]   Report Status PENDING   Incomplete  MRSA PCR SCREENING     Status: Abnormal   Collection Time    10/31/13  2:17 AM      Result Value Range Status   MRSA by PCR POSITIVE (*) NEGATIVE Final   Comment:            The GeneXpert MRSA Assay (FDA     approved for NASAL specimens     only), is one component of a     comprehensive MRSA colonization     surveillance program. It is not     intended to diagnose MRSA     infection nor to guide or     monitor treatment for     MRSA infections.     RESULT CALLED TO, READ BACK BY AND VERIFIED WITH:     J. [facility] RN 9:40 10/31/13 ([email or web address])    Medical History: Past Medical History  Diagnosis Date  .  Hyperlipemia   . Diabetes   . Hemiplegia   . Vertebral artery occlusion   . Substance abuse   . Hypertension   . Cerebral artery occlusion with cerebral infarction 08/22/2011    Medications:  Scheduled:  . Chlorhexidine Gluconate Cloth  6 each Topical Q0600  . clopidogrel  75 mg Oral Q breakfast  . docusate  100 mg Oral BID  . enoxaparin (LOVENOX) injection  40 mg Subcutaneous Q24H  . [START ON 11/01/2013] feeding supplement (GLUCERNA SHAKE)  237 mL Oral Q24H  . folic acid  1 mg Oral Daily  . gabapentin  200 mg Oral q morning - 10a  . insulin aspart  0-9 Units Subcutaneous TID WC  . [START ON 11/01/2013] levETIRAcetam  500 mg Oral BID  . lisinopril  20 mg Oral Daily  . mupirocin ointment  1 application Nasal BID  . piperacillin-tazobactam (ZOSYN)  IV  3.375 g Intravenous Q8H  . polyethylene glycol  17 g Oral Daily  . simvastatin  10 mg Oral q1800  . sodium chloride  3 mL Intravenous Q12H  . vancomycin  1,250 mg Intravenous Once  . [START ON 11/01/2013] vancomycin  750 mg Intravenous Q8H   Assessment: 68 yr old [facility] [place] a code stroke. His PMH includes DM, Hyperlipidemia and CVAx2. He was later made a code sepsis. Pharmacy to dose Vancomycin and Zosyn for sepsis. CT and MRI showed no evidence of acute stroke.   Goalof Therapy:  Vancomycin trough level 15-20 mcg/ml  Plan:  Zosyn 3.375 IV q8h (CrCl=87.9) Vancomycin 1250 mg loading dose and then 750 Q8h IV for maintenance dose. Vanc trough when appropriate.  Eugene GarnetPotter, Catori Panozzo Sue 10/31/2013,5:32 PM

## 2013-10-31 NOTE — Progress Notes (Signed)
MD notified RN to obtain bedside swallow, pt ruled out for stroke per MD and order received for BS swallow, pt BS swallow passed per this RN, diet started

## 2013-10-31 NOTE — Progress Notes (Signed)
10/31/2012 patient transfer from 3 south (3300) to 6East at 1615. He was lethargic when nurse when to see patient. Reported to Rn by nurse tech patient was very sleepy, and patient sbp was in the 70's. Rn went in to room did sternal rub and try to arouse the patient but he was unable to arouse. Eventually patient began grunting and moving arm, but still continue to be lethargic. Rapid response was called because patient blood pressure was 58/48 and 60/50. Md was notified and arrive on unit gave RN to give patient liter bolus of normal saline and reassess the blood pressure in 30 min to 1 hour. Patient scrotum have little swelling, sacrum is fine. Patient was place on telemetry and is total care. Patient limbs are flaccid. [facility] Tahjay Binion RN.

## 2013-10-31 NOTE — Progress Notes (Signed)
UR completed 

## 2013-10-31 NOTE — Significant Event (Addendum)
Rapid Response Event Note  Overview Called to assist with patient with low BP - s/p recent transfer from [facility].   Time Called: 1639 Arrival Time: 1645 Event Type: Neurologic;Hypotension  Initial Focused Assessment:  On arrival patient supine in bed - skin hot and dry -pink - resps reg and unlabored - some soft snoring noted.  Bil BS equal clear as much as can hear patient unable to follow commands to take deep breath.  Upper airway clear.  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  Dr. York SpanielBuriev present in room with RN Gloriajean DellNadine.  Rectal temp 100.4 manual BP right arm 70/40 HR 88 RR 18 O2 sat 94%.   Interventions:  Stat NS bolus started - 2nd IV  #20 angio started left mid arm times one attempt for abx - placed on 2 liter nasal cannula.  Dr. York SpanielBuriev speaking with family on phone - will treat medically but no heroic measures - code status changed to DNR.  Spoke with RN Mardene CelesteJoanna from [facility] re:  Events of today - patient was alert this AM - passed RN bedside swallow screen and ate breakfast.  Then was loaded with Keppra at 1045 - has been sleepy since.  After 500 cc NS bolus - BP left arm - 82/55 HR 80 RR 20  unlabored.  Vancomycin hung.  Moans to DPS - purposeful with left hand.  1758:  RIght arm BP 93/57 Left arm :  99/61  HR 76 RR 18 O2 sats 100 on 2 liter n/c.  BP responding to fluids and abx.   Patient arouses to name - purposeful - pulling covers over head.  Handoff to BeaverNadine.  Will watch as needed.  1800:  MD updated - BP 128/73 with NS bolus finishing.  1815:  BP 95/59 - seems fluid dependent - 2nd 500 cc NS started per order.  Handoff to [place]adine.   Event Summary: Name of Physician Notified: Dr. York SpanielBuriev at  (pta RRT)    at    Outcome: Stayed in room and stabalized  Event End Time: 1800  Delton PrairieBritt, Moreen Piggott L

## 2013-10-31 NOTE — Procedures (Signed)
EEG report.  Brief clinical history: 68 y/o with ristory right MCA distribution stroke admitted with altered mental status.  Technique: this is a 17 channel routine scalp EEG performed at the bedside with bipolar and monopolar montages arranged in accordance to the international 10/20 system of electrode placement. One channel was dedicated to EKG recording.  No activating procedures performed.  Description: as the study begins, there is diffuse, continuous 7-8 Hz activity that subsequently seems to be replaced by a normal sleep pattern. No evidence of electrographic seizures.  No focal or generalized epileptiform discharges noted.  EKG showed sinus rhythm.  Impression: this is an abnormal EEG with findings suggestive of a mild encephalopathy, non specific as to cause. Clinical correlation is advised.  Wyatt Portelasvaldo Khylin Gutridge, MD

## 2013-10-31 NOTE — Progress Notes (Signed)
EEG completed; results pending.    

## 2013-10-31 NOTE — Progress Notes (Addendum)
[facility] PROGRESS NOTE  [name] ZOX:[id number] DOB: 1946-10-13 DOA: 10/30/2013 PCP: Terald Sleeper, MD  Assessment/Plan: Principal Problem:  Altered mental status  Active Problems:  HTN (hypertension)  CVA (cerebral infarction)  Type II or unspecified type diabetes mellitus without mention of complication, not stated as uncontrolled   68 y.o. male with PMH of HTN, HPL, DM, h/o CVA with Right hemiparesis presented from NH with change in mental status   1. Change in mental status of unclear etiology; r/o seizure; no s/s of systemic infection  -symptoms are resolving; mental status currently at baseline; MRI: no acute findings, multiple old CVA, occulusions;  -per neurology pend EEG r/o seizure; hold Seroquel, and PM dose neurontindue to unresposiveness;   2. HTN resume home regimen; prn hydralazine, titrate PO meds as needed   3. DM no recent HA1c; cont iSS; check a1c   4. H/o CVA with R hemiparesis;  -cont plavix, statin, control BP; snf upon d/c   5. Initial hypothermia in ED; febrile 1/5 of unclear etiology; no s/s of infection;  -CXR no clear infiltrate, blood c/s NTED' pend urine c/s; check influenza    Addendum:  At 17.00 called to evaluated patient for hypotension, unresponsiveness; patient loaeded with IV keppra earlier today; SBP 60's with unresponsive; some response to noxious stimuli -started IV fluid resuscitation, empiric IV atx, cont supportive care; repeat blood, urine c/s; CXR;  Influenza neg;   -d/w patient's sister [name] [phone] DPOA; who said that patient is DNR never wanted resuscitation;    Neurology; if consultant consulted, please document name and whether formally or informally consulted   Code Status: full Family Communication: d/w sisters at the bedside yesterday (indicate person spoken with, relationship, and if by phone, the number) Disposition Plan: SNF pend clinical improvement    Consultants:  Neurology    Procedures:  EEG pend   Antibiotics:  none (indicate start date, and stop date if known)  HPI/Subjective: Alert, confused  Objective: Filed Vitals:   10/31/13 0436  BP: 102/59  Pulse: 100  Temp: 101.2 F (38.4 C)  Resp: 23    Intake/Output Summary (Last 24 hours) at 10/31/13 0831 Last data filed at 10/31/13 0500  Gross per 24 hour  Intake    525 ml  Output    200 ml  Net    325 ml   Filed Weights   10/30/13 1047 10/30/13 1935  Weight: 70.761 kg (156 lb) 69.4 kg (153 lb)    Exam:   General:  alert  Cardiovascular: s1,s2 rrr  Respiratory: cta BL  Abdomen: soft, nt, nd   Musculoskeletal: no LE edema   Data Reviewed: Basic Metabolic Panel:  Recent Labs Lab 10/30/13 1041 10/30/13 1045  NA 140 142  K 3.8 3.7  CL 103 104  CO2 24  --   GLUCOSE 155* 161*  BUN 15 16  CREATININE 0.63 0.70  CALCIUM 9.0  --    Liver Function Tests:  Recent Labs Lab 10/30/13 1041  AST 12  ALT 13  ALKPHOS 52  BILITOT 0.3  PROT 7.2  ALBUMIN 3.9   No results found for this basename: LIPASE, AMYLASE,  in the last 168 hours No results found for this basename: AMMONIA,  in the last 168 hours CBC:  Recent Labs Lab 10/30/13 1041 10/30/13 1045  WBC 10.2  --   NEUTROABS 7.0  --   HGB 14.0 14.6  HCT 41.1 43.0  MCV 91.3  --   PLT 234  --  Cardiac Enzymes:  Recent Labs Lab 10/30/13 1041  TROPONINI <0.30   BNP (last 3 results) No results found for this basename: PROBNP,  in the last 8760 hours CBG:  Recent Labs Lab 10/30/13 1042 10/30/13 2027 10/31/13 0029 10/31/13 0435  GLUCAP 143* 97 162* 161*    Recent Results (from the past 240 hour(s))  CULTURE, BLOOD (ROUTINE X 2)     Status: None   Collection Time    10/30/13  2:00 PM      Result Value Range Status   Specimen Description BLOOD RIGHT HAND   Final   Special Requests BOTTLES DRAWN AEROBIC ONLY 8CC   Final   Culture  Setup Time     Final   Value: 10/30/2013 21:17     Performed at  [facility]   Culture     Final   Value:        BLOOD CULTURE RECEIVED NO GROWTH TO DATE CULTURE WILL BE HELD FOR 5 DAYS BEFORE ISSUING A FINAL NEGATIVE REPORT     Performed at [facility]   Report Status PENDING   Incomplete  CULTURE, BLOOD (ROUTINE X 2)     Status: None   Collection Time    10/30/13  2:10 PM      Result Value Range Status   Specimen Description BLOOD LEFT ARM   Final   Special Requests BOTTLES DRAWN AEROBIC AND ANAEROBIC R 5CC B 10CC   Final   Culture  Setup Time     Final   Value: 10/30/2013 21:18     Performed at [facility]   Culture     Final   Value:        BLOOD CULTURE RECEIVED NO GROWTH TO DATE CULTURE WILL BE HELD FOR 5 DAYS BEFORE ISSUING A FINAL NEGATIVE REPORT     Performed at [facility]   Report Status PENDING   Incomplete     Studies: Ct Head Wo Contrast  10/30/2013   CLINICAL DATA:  Mental status change with acute episode of unresponsiveness. Code stroke. History of stroke.  EXAM: CT HEAD WITHOUT CONTRAST  TECHNIQUE: Contiguous axial images were obtained from the base of the skull through the vertex without intravenous contrast.  COMPARISON:  None.  FINDINGS: There is right parietal encephalomalacia consistent with an old right MCA stroke. Atrophy and periventricular white matter disease are noted. There is no evidence of acute intracranial hemorrhage, mass lesion, brain edema or extra-axial fluid collection. There is no hydrocephalus.  The visualized paranasal sinuses, mastoid air cells and middle ears are clear. The calvarium is intact.  IMPRESSION: 1. No evidence of acute intracranial hemorrhage or acute stroke. 2. Old right MCA distribution infarct with atrophy and chronic small vessel ischemic changes. 3. These results were called by telephone at the time of interpretation on 10/30/2013 at 11:00 AM to Dr. Shawna Orleans BELFI , who verbally acknowledged these results.   Electronically Signed   By: Roxy Horseman M.D.   On:  10/30/2013 11:00   Mr Maxine Glenn Head Wo Contrast  10/30/2013   CLINICAL DATA:  Acute onset of unresponsiveness.  EXAM: MRI HEAD WITHOUT CONTRAST  MRA HEAD WITHOUT CONTRAST  TECHNIQUE: Multiplanar, multiecho pulse sequences of the brain and surrounding structures were obtained without intravenous contrast. Angiographic images of the head were obtained using MRA technique without contrast.  COMPARISON:  Head CT earlier the same day  FINDINGS: MRI HEAD FINDINGS  There is no evidence of acute infarct. Right  frontoparietal encephalomalacia is consistent with remote MCA infarct. Encephalomalacia/atrophy is noted involving the left cerebral peduncle. Small, old cerebellar infarcts are noted, right greater than left. Periventricular white matter T2 hyperintensities are compatible with moderate chronic small vessel ischemic disease. There is moderate cerebral atrophy, mildly advanced for age. Remote microhemorrhages are noted in the right greater than left cerebellar hemispheres as well as left basal ganglia. There is no evidence of mass, midline shift, or extra-axial fluid collection. Orbits are unremarkable. Major intracranial vascular flow voids are unremarkable. Visualized paranasal sinuses and mastoid air cells are clear. Calvarium is normal in signal.  MRA HEAD FINDINGS  Images are mildly degraded by motion artifact. Visualized distal left vertebral artery is patent and dominant, demonstrating mild diffuse irregularity. The distal right vertebral artery is not well identified. Basilar artery is patent with mild irregularity but no evidence of high-grade stenosis. Left PICA appears patent, although its origin is not well evaluated. SCAs are patent bilaterally. Right PCA origin is patent. Left P1 segment appears either occluded or congenitally hypoplastic/aplastic. There is a small left posterior communicating artery. Minimal, intermittent flow is identified in left PCA branches more distally. There is mild to moderate  irregularity of the right PCA.  Internal carotid arteries are patent from skullbase to carotid terminus. The left intracranial internal carotid artery appears mildly dominant compared to the right. There is mild, diffuse narrowing and irregularity of the left carotid siphon and supraclinoid left ICA. The right A1 segment is markedly hypoplastic. Right A2 segment is predominantly supplied via the anterior communicating artery. The left ACA is unremarkable. MCA origins are patent bilaterally. Visualized left MCA branches are patent with mild irregularity. Right MCA trifurcation is patent, however there is occlusion of small distal branches likely related to prior infarct. There also a few areas of apparent moderate focal stenosis involving proximal M2 branches on the right. No intracranial aneurysm is identified.  IMPRESSION: 1. No evidence of acute infarct or other acute intracranial abnormality. 2. Remote right MCA and bilateral cerebellar infarcts. 3. Mild to moderate irregularity and narrowing of right greater than left MCA branches consistent with atherosclerosis. 4. Occlusion of distal right MCA branches, likely related to prior infarct. 5. Mild to moderate posterior circulation irregularity, consistent with atherosclerosis. Left P1 segment appears either occluded or congenitally aplastic, with minimal flow in more distal left PCA branches. 6. 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   Electronically Signed   By: Sebastian AcheAllen  Grady   On: 10/30/2013 12:30   Mr Brain Wo Contrast  10/30/2013   CLINICAL DATA:  Acute onset of unresponsiveness.  EXAM: MRI HEAD WITHOUT CONTRAST  MRA HEAD WITHOUT CONTRAST  TECHNIQUE: Multiplanar, multiecho pulse sequences of the brain and surrounding structures were obtained without intravenous contrast. Angiographic images of the head were obtained using MRA technique without contrast.   COMPARISON:  Head CT earlier the same day  FINDINGS: MRI HEAD FINDINGS  There is no evidence of acute infarct. Right frontoparietal encephalomalacia is consistent with remote MCA infarct. Encephalomalacia/atrophy is noted involving the left cerebral peduncle. Small, old cerebellar infarcts are noted, right greater than left. Periventricular white matter T2 hyperintensities are compatible with moderate chronic small vessel ischemic disease. There is moderate cerebral atrophy, mildly advanced for age. Remote microhemorrhages are noted in the right greater than left cerebellar hemispheres as well as left basal ganglia. There is no evidence of mass, midline shift, or extra-axial fluid collection. Orbits are unremarkable. Major  intracranial vascular flow voids are unremarkable. Visualized paranasal sinuses and mastoid air cells are clear. Calvarium is normal in signal.  MRA HEAD FINDINGS  Images are mildly degraded by motion artifact. Visualized distal left vertebral artery is patent and dominant, demonstrating mild diffuse irregularity. The distal right vertebral artery is not well identified. Basilar artery is patent with mild irregularity but no evidence of high-grade stenosis. Left PICA appears patent, although its origin is not well evaluated. SCAs are patent bilaterally. Right PCA origin is patent. Left P1 segment appears either occluded or congenitally hypoplastic/aplastic. There is a small left posterior communicating artery. Minimal, intermittent flow is identified in left PCA branches more distally. There is mild to moderate irregularity of the right PCA.  Internal carotid arteries are patent from skullbase to carotid terminus. The left intracranial internal carotid artery appears mildly dominant compared to the right. There is mild, diffuse narrowing and irregularity of the left carotid siphon and supraclinoid left ICA. The right A1 segment is markedly hypoplastic. Right A2 segment is predominantly supplied  via the anterior communicating artery. The left ACA is unremarkable. MCA origins are patent bilaterally. Visualized left MCA branches are patent with mild irregularity. Right MCA trifurcation is patent, however there is occlusion of small distal branches likely related to prior infarct. There also a few areas of apparent moderate focal stenosis involving proximal M2 branches on the right. No intracranial aneurysm is identified.  IMPRESSION: 1. No evidence of acute infarct or other acute intracranial abnormality. 2. Remote right MCA and bilateral cerebellar infarcts. 3. Mild to moderate irregularity and narrowing of right greater than left MCA branches consistent with atherosclerosis. 4. Occlusion of distal right MCA branches, likely related to prior infarct. 5. Mild to moderate posterior circulation irregularity, consistent with atherosclerosis. Left P1 segment appears either occluded or congenitally aplastic, with minimal flow in more distal left PCA branches. 6. 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   Electronically Signed   By: Sebastian Ache   On: 10/30/2013 12:30   Dg Chest Port 1 View  10/30/2013   CLINICAL DATA:  Shortness of breath.  Altered mental status  EXAM: PORTABLE CHEST - 1 VIEW  COMPARISON:  None  FINDINGS: Difficult positioning required holding the patient during imaging. The costophrenic angles were excluded.  Old right rib deformity from prior fracture. The lungs appear clear. Cardiac and mediastinal margins appear normal.  IMPRESSION: 1. No acute thoracic findings.   Electronically Signed   By: Herbie Baltimore M.D.   On: 10/30/2013 12:58    Scheduled Meds: . sodium chloride   Intravenous STAT  . clopidogrel  75 mg Oral Q breakfast  . docusate  100 mg Oral BID  . enoxaparin (LOVENOX) injection  40 mg Subcutaneous Q24H  . folic acid  1 mg Oral Daily  . gabapentin  200 mg Oral q morning -  10a  . insulin aspart  0-9 Units Subcutaneous TID WC  . lisinopril  20 mg Oral Daily  . polyethylene glycol  17 g Oral Daily  . simvastatin  10 mg Oral q1800  . sodium chloride  3 mL Intravenous Q12H   Continuous Infusions:   Principal Problem:   Altered mental status Active Problems:   HTN (hypertension)   CVA (cerebral infarction)   Type II or unspecified type diabetes mellitus without mention of complication, not stated as uncontrolled   Unresponsive   Unresponsiveness    Time spent: >35 minutes  Esperanza Sheets  [facility] Pager [phone]. If 7PM-7AM, please contact night-coverage at [email or web address], password [facility] 10/31/2013, 8:31 AM  LOS: 1 day

## 2013-10-31 NOTE — Significant Event (Addendum)
Rapid Response Event Note  Overview: Time Called: 2020 Arrival Time: 2022 Event Type: Hypotension  Initial Focused Assessment: Called by Elnita Maxwellheryl RN regarding pt's continued hypotension 77/30 after 1500cc NS.   On arrival to room pt lying flat, responds to sternal rub with moaning and flailing upper extremeties.  Does not follow commands.  Skin warm to touch.  Bilat Breath sounds diminished in bases.  Respirations non labored with O2 sats 97 % on 2 l [place].  Given 500 cc NS bolus with BP 105/70 HR 71 midway through boluis and 98/56 at completion of 500 cc.  Spoke with Tama GanderKatherine Schorr, NP who has ordered an additional 1000cc NS bolus.  If pt does not respond to the third bolus he will likely need transfer to higher level of care to initiate pressors.  Handoff to [place] Park[place], RN   Interventions:   Event Summary: Name of Physician Notified: Tama GanderKatherine Schorr, NP at 2025    at    Outcome: Stayed in room and stabalized    Javarus Dorner, [place] Slideraula K

## 2013-11-01 LAB — GLUCOSE, CAPILLARY
GLUCOSE-CAPILLARY: 118 mg/dL — AB (ref 70–99)
GLUCOSE-CAPILLARY: 123 mg/dL — AB (ref 70–99)
Glucose-Capillary: 164 mg/dL — ABNORMAL HIGH (ref 70–99)
Glucose-Capillary: 158 mg/dL — ABNORMAL HIGH (ref 70–99)

## 2013-11-01 LAB — URINALYSIS, ROUTINE W REFLEX MICROSCOPIC
Bilirubin Urine: NEGATIVE
GLUCOSE, UA: NEGATIVE mg/dL
HGB URINE DIPSTICK: NEGATIVE
KETONES UR: NEGATIVE mg/dL
Leukocytes, UA: NEGATIVE
Nitrite: NEGATIVE
PROTEIN: NEGATIVE mg/dL
Specific Gravity, Urine: 1.011 (ref 1.005–1.030)
Urobilinogen, UA: 0.2 mg/dL (ref 0.0–1.0)
pH: 5.5 (ref 5.0–8.0)

## 2013-11-01 LAB — CBC
HCT: 35.4 % — ABNORMAL LOW (ref 39.0–52.0)
HEMOGLOBIN: 11.8 g/dL — AB (ref 13.0–17.0)
MCH: 30.8 pg (ref 26.0–34.0)
MCHC: 33.3 g/dL (ref 30.0–36.0)
MCV: 92.4 fL (ref 78.0–100.0)
PLATELETS: 222 10*3/uL (ref 150–400)
RBC: 3.83 MIL/uL — ABNORMAL LOW (ref 4.22–5.81)
RDW: 13.9 % (ref 11.5–15.5)
WBC: 12.7 10*3/uL — ABNORMAL HIGH (ref 4.0–10.5)

## 2013-11-01 NOTE — Progress Notes (Signed)
[facility] PROGRESS NOTE  Keanon Bevins GMW:[id number] DOB: Jun 15, 1946 DOA: 10/30/2013 PCP: Terald Sleeper, MD  Assessment/Plan: Principal Problem:  Altered mental status  Active Problems:  HTN (hypertension)  CVA (cerebral infarction)  Type II or unspecified type diabetes mellitus without mention of complication, not stated as uncontrolled   68 y.o. male with PMH of HTN, HPL, DM, h/o CVA with Right hemiparesis presented from NH with change in mental status   1. Change in mental status of unclear etiology; r/o seizure; MRI: no acute findings, multiple old CVA occulusions; abnormal EEG with findings suggestive of a mild encephalopathy -per neurology started keppra; hold Seroquel, and PM dose neurontindue to unresposiveness;  -another episode of unresponsiveness on 1/5; no seizure; but hypotensive; symptoms have resolved patient again at baseline    2. Hypotension, SBP 60's; hypothermic then febrile 1/5; unclear etiology; CXR: no clear infiltrate; UA unremarkable; blood cultures NTD; repeat blood c/s pend;  -started on empiric IV atx, IVF resuscitation;   3. DM no recent HA1c; cont iSS; check a1c   4. H/o CVA with R hemiparesis;  -cont plavix, statin, control BP; snf upon d/c   5. HTN holding BP meds due to hypotension   -d/w patient's sister [name] [phone] DPOA; who said that patient is DNR never wanted resuscitation;    Neurology; if consultant consulted, please document name and whether formally or informally consulted   Code Status: full Family Communication: d/w sisters at the bedside yesterday (indicate person spoken with, relationship, and if by phone, the number) Disposition Plan: SNF pend clinical improvement    Consultants:  Neurology   Procedures:  EEG pend   Antibiotics:  none (indicate start date, and stop date if known)  HPI/Subjective: Alert, confused  Objective: Filed Vitals:   11/01/13 0905  BP: 154/79  Pulse: 58  Temp:  98 F (36.7 C)  Resp: 18    Intake/Output Summary (Last 24 hours) at 11/01/13 1057 Last data filed at 11/01/13 0909  Gross per 24 hour  Intake  647.5 ml  Output   1350 ml  Net -702.5 ml   Filed Weights   10/30/13 1047 10/30/13 1935  Weight: 70.761 kg (156 lb) 69.4 kg (153 lb)    Exam:   General:  alert  Cardiovascular: s1,s2 rrr  Respiratory: cta BL  Abdomen: soft, nt, nd   Musculoskeletal: no LE edema   Data Reviewed: Basic Metabolic Panel:  Recent Labs Lab 10/30/13 1041 10/30/13 1045  NA 140 142  K 3.8 3.7  CL 103 104  CO2 24  --   GLUCOSE 155* 161*  BUN 15 16  CREATININE 0.63 0.70  CALCIUM 9.0  --    Liver Function Tests:  Recent Labs Lab 10/30/13 1041  AST 12  ALT 13  ALKPHOS 52  BILITOT 0.3  PROT 7.2  ALBUMIN 3.9   No results found for this basename: LIPASE, AMYLASE,  in the last 168 hours No results found for this basename: AMMONIA,  in the last 168 hours CBC:  Recent Labs Lab 10/30/13 1041 10/30/13 1045 11/01/13 0530  WBC 10.2  --  12.7*  NEUTROABS 7.0  --   --   HGB 14.0 14.6 11.8*  HCT 41.1 43.0 35.4*  MCV 91.3  --  92.4  PLT 234  --  222   Cardiac Enzymes:  Recent Labs Lab 10/30/13 1041  TROPONINI <0.30   BNP (last 3 results) No results found for this basename: PROBNP,  in the last 8760 hours CBG:  Recent Labs Lab 10/31/13 0818 10/31/13 1120 10/31/13 1602 10/31/13 2143 11/01/13 0719  GLUCAP 135* 154* 129* 95 118*    Recent Results (from the past 240 hour(s))  URINE CULTURE     Status: None   Collection Time    10/30/13 12:23 PM      Result Value Range Status   Specimen Description URINE, CLEAN CATCH   Final   Special Requests NONE   Final   Culture  Setup Time     Final   Value: 10/31/2013 01:31     Performed at [facility] Count     Final   Value: NO GROWTH     Performed at [facility]   Culture     Final   Value: NO GROWTH     Performed at [facility]   Report  Status 10/31/2013 FINAL   Final  CULTURE, BLOOD (ROUTINE X 2)     Status: None   Collection Time    10/30/13  2:00 PM      Result Value Range Status   Specimen Description BLOOD RIGHT HAND   Final   Special Requests BOTTLES DRAWN AEROBIC ONLY 8CC   Final   Culture  Setup Time     Final   Value: 10/30/2013 21:17     Performed at [facility]   Culture     Final   Value:        BLOOD CULTURE RECEIVED NO GROWTH TO DATE CULTURE WILL BE HELD FOR 5 DAYS BEFORE ISSUING A FINAL NEGATIVE REPORT     Performed at [facility]   Report Status PENDING   Incomplete  CULTURE, BLOOD (ROUTINE X 2)     Status: None   Collection Time    10/30/13  2:10 PM      Result Value Range Status   Specimen Description BLOOD LEFT ARM   Final   Special Requests BOTTLES DRAWN AEROBIC AND ANAEROBIC R 5CC B 10CC   Final   Culture  Setup Time     Final   Value: 10/30/2013 21:18     Performed at [facility]   Culture     Final   Value:        BLOOD CULTURE RECEIVED NO GROWTH TO DATE CULTURE WILL BE HELD FOR 5 DAYS BEFORE ISSUING A FINAL NEGATIVE REPORT     Performed at [facility]   Report Status PENDING   Incomplete  MRSA PCR SCREENING     Status: Abnormal   Collection Time    10/31/13  2:17 AM      Result Value Range Status   MRSA by PCR POSITIVE (*) NEGATIVE Final   Comment:            The GeneXpert MRSA Assay (FDA     approved for NASAL specimens     only), is one component of a     comprehensive MRSA colonization     surveillance program. It is not     intended to diagnose MRSA     infection nor to guide or     monitor treatment for     MRSA infections.     RESULT CALLED TO, READ BACK BY AND VERIFIED WITH:     J. [facility] RN 9:40 10/31/13 ([email or web address])     Studies: Mr Shirlee Latch Wo Contrast  10/30/2013   CLINICAL DATA:  Acute onset of unresponsiveness.  EXAM: MRI HEAD WITHOUT CONTRAST  MRA HEAD WITHOUT CONTRAST  TECHNIQUE: Multiplanar, multiecho pulse sequences of the brain  and surrounding structures were obtained without intravenous contrast. Angiographic images of the head were obtained using MRA technique without contrast.  COMPARISON:  Head CT earlier the same day  FINDINGS: MRI HEAD FINDINGS  There is no evidence of acute infarct. Right frontoparietal encephalomalacia is consistent with remote MCA infarct. Encephalomalacia/atrophy is noted involving the left cerebral peduncle. Small, old cerebellar infarcts are noted, right greater than left. Periventricular white matter T2 hyperintensities are compatible with moderate chronic small vessel ischemic disease. There is moderate cerebral atrophy, mildly advanced for age. Remote microhemorrhages are noted in the right greater than left cerebellar hemispheres as well as left basal ganglia. There is no evidence of mass, midline shift, or extra-axial fluid collection. Orbits are unremarkable. Major intracranial vascular flow voids are unremarkable. Visualized paranasal sinuses and mastoid air cells are clear. Calvarium is normal in signal.  MRA HEAD FINDINGS  Images are mildly degraded by motion artifact. Visualized distal left vertebral artery is patent and dominant, demonstrating mild diffuse irregularity. The distal right vertebral artery is not well identified. Basilar artery is patent with mild irregularity but no evidence of high-grade stenosis. Left PICA appears patent, although its origin is not well evaluated. SCAs are patent bilaterally. Right PCA origin is patent. Left P1 segment appears either occluded or congenitally hypoplastic/aplastic. There is a small left posterior communicating artery. Minimal, intermittent flow is identified in left PCA branches more distally. There is mild to moderate irregularity of the right PCA.  Internal carotid arteries are patent from skullbase to carotid terminus. The left intracranial internal carotid artery appears mildly dominant compared to the right. There is mild, diffuse narrowing and  irregularity of the left carotid siphon and supraclinoid left ICA. The right A1 segment is markedly hypoplastic. Right A2 segment is predominantly supplied via the anterior communicating artery. The left ACA is unremarkable. MCA origins are patent bilaterally. Visualized left MCA branches are patent with mild irregularity. Right MCA trifurcation is patent, however there is occlusion of small distal branches likely related to prior infarct. There also a few areas of apparent moderate focal stenosis involving proximal M2 branches on the right. No intracranial aneurysm is identified.  IMPRESSION: 1. No evidence of acute infarct or other acute intracranial abnormality. 2. Remote right MCA and bilateral cerebellar infarcts. 3. Mild to moderate irregularity and narrowing of right greater than left MCA branches consistent with atherosclerosis. 4. Occlusion of distal right MCA branches, likely related to prior infarct. 5. Mild to moderate posterior circulation irregularity, consistent with atherosclerosis. Left P1 segment appears either occluded or congenitally aplastic, with minimal flow in more distal left PCA branches. 6. 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   Electronically Signed   By: Sebastian AcheAllen  Grady   On: 10/30/2013 12:30   Mr Brain Wo Contrast  10/30/2013   CLINICAL DATA:  Acute onset of unresponsiveness.  EXAM: MRI HEAD WITHOUT CONTRAST  MRA HEAD WITHOUT CONTRAST  TECHNIQUE: Multiplanar, multiecho pulse sequences of the brain and surrounding structures were obtained without intravenous contrast. Angiographic images of the head were obtained using MRA technique without contrast.  COMPARISON:  Head CT earlier the same day  FINDINGS: MRI HEAD FINDINGS  There is no evidence of acute infarct. Right frontoparietal encephalomalacia is consistent with remote MCA infarct. Encephalomalacia/atrophy is noted involving the left  cerebral peduncle. Small, old cerebellar infarcts are noted, right greater than left.  Periventricular white matter T2 hyperintensities are compatible with moderate chronic small vessel ischemic disease. There is moderate cerebral atrophy, mildly advanced for age. Remote microhemorrhages are noted in the right greater than left cerebellar hemispheres as well as left basal ganglia. There is no evidence of mass, midline shift, or extra-axial fluid collection. Orbits are unremarkable. Major intracranial vascular flow voids are unremarkable. Visualized paranasal sinuses and mastoid air cells are clear. Calvarium is normal in signal.  MRA HEAD FINDINGS  Images are mildly degraded by motion artifact. Visualized distal left vertebral artery is patent and dominant, demonstrating mild diffuse irregularity. The distal right vertebral artery is not well identified. Basilar artery is patent with mild irregularity but no evidence of high-grade stenosis. Left PICA appears patent, although its origin is not well evaluated. SCAs are patent bilaterally. Right PCA origin is patent. Left P1 segment appears either occluded or congenitally hypoplastic/aplastic. There is a small left posterior communicating artery. Minimal, intermittent flow is identified in left PCA branches more distally. There is mild to moderate irregularity of the right PCA.  Internal carotid arteries are patent from skullbase to carotid terminus. The left intracranial internal carotid artery appears mildly dominant compared to the right. There is mild, diffuse narrowing and irregularity of the left carotid siphon and supraclinoid left ICA. The right A1 segment is markedly hypoplastic. Right A2 segment is predominantly supplied via the anterior communicating artery. The left ACA is unremarkable. MCA origins are patent bilaterally. Visualized left MCA branches are patent with mild irregularity. Right MCA trifurcation is patent, however there is occlusion of small  distal branches likely related to prior infarct. There also a few areas of apparent moderate focal stenosis involving proximal M2 branches on the right. No intracranial aneurysm is identified.  IMPRESSION: 1. No evidence of acute infarct or other acute intracranial abnormality. 2. Remote right MCA and bilateral cerebellar infarcts. 3. Mild to moderate irregularity and narrowing of right greater than left MCA branches consistent with atherosclerosis. 4. Occlusion of distal right MCA branches, likely related to prior infarct. 5. Mild to moderate posterior circulation irregularity, consistent with atherosclerosis. Left P1 segment appears either occluded or congenitally aplastic, with minimal flow in more distal left PCA branches. 6. 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   Electronically Signed   By: Sebastian Ache   On: 10/30/2013 12:30   Dg Chest Port 1 View  10/31/2013   CLINICAL DATA:  Fever  EXAM: PORTABLE CHEST - 1 VIEW  COMPARISON:  Prior radiograph from 10/30/2013  FINDINGS: The cardiac and mediastinal silhouettes are stable in size and contour, and remain within normal limits.  The lungs are mildly hypoinflated. Mild bibasilar linear opacities most likely reflect subsegmental atelectasis. There is mild bronchovascular crowding at the right hilum. No definite focal infiltrate identified. No pulmonary edema or pleural effusion. No pneumothorax.  No acute osseous abnormality identified.  IMPRESSION: Mild hypoinflation with secondary bibasilar atelectasis and/or bronchovascular crowding. No definite focal infiltrates identified.   Electronically Signed   By: Rise Mu M.D.   On: 10/31/2013 20:16   Dg Chest Port 1 View  10/30/2013   CLINICAL DATA:  Shortness of breath.  Altered mental status  EXAM: PORTABLE CHEST - 1 VIEW  COMPARISON:  None  FINDINGS: Difficult positioning required holding the patient  during imaging. The costophrenic angles were excluded.  Old right rib deformity from prior fracture. The lungs appear clear. Cardiac and mediastinal margins appear normal.  IMPRESSION:  1. No acute thoracic findings.   Electronically Signed   By: Herbie Baltimore M.D.   On: 10/30/2013 12:58    Scheduled Meds: . Chlorhexidine Gluconate Cloth  6 each Topical Q0600  . clopidogrel  75 mg Oral Q breakfast  . docusate  100 mg Oral BID  . enoxaparin (LOVENOX) injection  40 mg Subcutaneous Q24H  . feeding supplement (GLUCERNA SHAKE)  237 mL Oral Q24H  . folic acid  1 mg Oral Daily  . gabapentin  200 mg Oral q morning - 10a  . insulin aspart  0-9 Units Subcutaneous TID WC  . levETIRAcetam  500 mg Oral BID  . lisinopril  20 mg Oral Daily  . mupirocin ointment  1 application Nasal BID  . piperacillin-tazobactam (ZOSYN)  IV  3.375 g Intravenous Q8H  . polyethylene glycol  17 g Oral Daily  . simvastatin  10 mg Oral q1800  . sodium chloride  3 mL Intravenous Q12H  . vancomycin  750 mg Intravenous Q8H   Continuous Infusions: . sodium chloride 75 mL/hr at 11/01/13 0100    Principal Problem:   Altered mental status Active Problems:   HTN (hypertension)   CVA (cerebral infarction)   Type II or unspecified type diabetes mellitus without mention of complication, not stated as uncontrolled   Unresponsive   Unresponsiveness   Fever    Time spent: >35 minutes     Esperanza Sheets  [facility] Pager [phone]. If 7PM-7AM, please contact night-coverage at [email or web address], password [facility] 11/01/2013, 10:57 AM  LOS: 2 days

## 2013-11-01 NOTE — Progress Notes (Signed)
Pt's mental status prohibits him from understanding our safety fall plan and signing our fall plan contract. Will await for family to be present to explain it and get an signature.

## 2013-11-01 NOTE — Progress Notes (Signed)
SLP Cancellation Note  Patient Details Name: Jermaine CoyerWilliam Little MRN: [id number] DOB: 10/02/1946   Cancelled treatment:        Order received for swallow evaluation, however, RN notes indicate pt. Passed the stroke swallow screen and a diet was initiated.  RN reports pt. Ate well at breakfast and has had no difficulty swallowing pills whole with applesauce.  Will defer evaluation.   Maryjo RochesterWillis, [place] Pulse T 11/01/2013, 11:56 AM

## 2013-11-02 DIAGNOSIS — R404 Transient alteration of awareness: Secondary | ICD-10-CM

## 2013-11-02 DIAGNOSIS — E119 Type 2 diabetes mellitus without complications: Secondary | ICD-10-CM

## 2013-11-02 DIAGNOSIS — R4182 Altered mental status, unspecified: Secondary | ICD-10-CM

## 2013-11-02 DIAGNOSIS — I6350 Cerebral infarction due to unspecified occlusion or stenosis of unspecified cerebral artery: Secondary | ICD-10-CM

## 2013-11-02 DIAGNOSIS — I10 Essential (primary) hypertension: Secondary | ICD-10-CM

## 2013-11-02 LAB — BASIC METABOLIC PANEL
BUN: 8 mg/dL (ref 6–23)
CALCIUM: 8.3 mg/dL — AB (ref 8.4–10.5)
CO2: 20 mEq/L (ref 19–32)
CREATININE: 0.73 mg/dL (ref 0.50–1.35)
Chloride: 107 mEq/L (ref 96–112)
GFR calc Af Amer: >90 mL/min (ref >90)
GFR calc non Af Amer: >90 mL/min (ref >90)
GLUCOSE: 145 mg/dL — AB (ref 70–99)
Potassium: 3.2 mEq/L — ABNORMAL LOW (ref 3.7–5.3)
SODIUM: 142 meq/L (ref 137–147)

## 2013-11-02 LAB — GLUCOSE, CAPILLARY
GLUCOSE-CAPILLARY: 189 mg/dL — AB (ref 70–99)
GLUCOSE-CAPILLARY: 121 mg/dL — AB (ref 70–99)
Glucose-Capillary: 130 mg/dL — ABNORMAL HIGH (ref 70–99)
Glucose-Capillary: 215 mg/dL — ABNORMAL HIGH (ref 70–99)

## 2013-11-02 LAB — CBC
HCT: 34.4 % — ABNORMAL LOW (ref 39.0–52.0)
Hemoglobin: 11.5 g/dL — ABNORMAL LOW (ref 13.0–17.0)
MCH: 30.6 pg (ref 26.0–34.0)
MCHC: 33.4 g/dL (ref 30.0–36.0)
MCV: 91.5 fL (ref 78.0–100.0)
PLATELETS: 243 10*3/uL (ref 150–400)
RBC: 3.76 MIL/uL — ABNORMAL LOW (ref 4.22–5.81)
RDW: 13.4 % (ref 11.5–15.5)
WBC: 12.2 10*3/uL — ABNORMAL HIGH (ref 4.0–10.5)

## 2013-11-02 NOTE — Progress Notes (Signed)
[facility] PROGRESS NOTE  [name] ZOX:[id number] DOB: April 19, 1946 DOA: 10/30/2013 PCP: Terald Sleeper, MD  Assessment/Plan: Principal Problem:  Altered mental status  Active Problems:  HTN (hypertension)  CVA (cerebral infarction)  Type II or unspecified type diabetes mellitus without mention of complication, not stated as uncontrolled   68 y.o. male with PMH of HTN, HPL, DM, h/o CVA with Right hemiparesis presented from NH with change in mental status   1. Change in mental status of unclear etiology; r/o seizure; MRI: no acute findings, multiple old CVA occulusions; abnormal EEG with findings suggestive of a mild encephalopathy -Continue keppra (started by neurology); continue holding hold Seroquel, and PM dose neurontin as due to unresposiveness on admission;  -another episode of unresponsiveness on 1/5; no seizure; but hypotensive; symptoms have resolved patient again at baseline   -No further episodes of unresponsiveness so far since 1/5, follow. 2. Hypotension, SBP 60's; hypothermic then febrile 1/5; unclear etiology; CXR: no clear infiltrate; UA unremarkable; blood cultures NTD; repeat blood c/s pend;  -started on empiric IV atx, IVF resuscitation;   3. DM no recent HA1c; cont iSS; check a1c   4. H/o CVA with R hemiparesis;  -cont plavix, statin, control BP; snf upon d/c   5. HTN holding BP meds due to hypotension   -Dr. York Spaniel  d/w patient's sister [name] [phone] DPOA; who said that patient is DNR never wanted resuscitation;      Code Status: full Family Communication: d/w sisters at the bedside  Disposition Plan: SNF pend clinical improvement    Consultants:  Neurology   Procedures:  EEG done on 1/5-awaiting results.  Antibiotics:  Vancomycin and Zosyn started on 1/4  HPI/Subjective: Alert oriented x1, confused  Objective: Filed Vitals:   11/02/13 1006  BP: 127/73  Pulse: 60  Temp: 98.7 F (37.1 C)  Resp: 18     Intake/Output Summary (Last 24 hours) at 11/02/13 1540 Last data filed at 11/02/13 0900  Gross per 24 hour  Intake   1265 ml  Output   1850 ml  Net   -585 ml   Filed Weights   10/30/13 1047 10/30/13 1935 11/01/13 2145  Weight: 70.761 kg (156 lb) 69.4 kg (153 lb) 74.4 kg (164 lb 0.4 oz)    Exam:   General:  alert  Cardiovascular: s1,s2 rrr  Respiratory: cta BL  Abdomen: soft, nt, nd   Musculoskeletal: no LE edema   Data Reviewed: Basic Metabolic Panel:  Recent Labs Lab 10/30/13 1041 10/30/13 1045 11/02/13 0425  NA 140 142 142  K 3.8 3.7 3.2*  CL 103 104 107  CO2 24  --  20  GLUCOSE 155* 161* 145*  BUN 15 16 8   CREATININE 0.63 0.70 0.73  CALCIUM 9.0  --  8.3*   Liver Function Tests:  Recent Labs Lab 10/30/13 1041  AST 12  ALT 13  ALKPHOS 52  BILITOT 0.3  PROT 7.2  ALBUMIN 3.9   No results found for this basename: LIPASE, AMYLASE,  in the last 168 hours No results found for this basename: AMMONIA,  in the last 168 hours CBC:  Recent Labs Lab 10/30/13 1041 10/30/13 1045 11/01/13 0530 11/02/13 0425  WBC 10.2  --  12.7* 12.2*  NEUTROABS 7.0  --   --   --   HGB 14.0 14.6 11.8* 11.5*  HCT 41.1 43.0 35.4* 34.4*  MCV 91.3  --  92.4 91.5  PLT 234  --  222 243   Cardiac Enzymes:  Recent  Labs Lab 10/30/13 1041  TROPONINI <0.30   BNP (last 3 results) No results found for this basename: PROBNP,  in the last 8760 hours CBG:  Recent Labs Lab 11/01/13 1108 11/01/13 1615 11/01/13 2112 11/02/13 0828 11/02/13 1147  GLUCAP 164* 123* 158* 130* 189*    Recent Results (from the past 240 hour(s))  URINE CULTURE     Status: None   Collection Time    10/30/13 12:23 PM      Result Value Range Status   Specimen Description URINE, CLEAN CATCH   Final   Special Requests NONE   Final   Culture  Setup Time     Final   Value: 10/31/2013 01:31     Performed at [facility] Count     Final   Value: NO GROWTH     Performed at  [facility]   Culture     Final   Value: NO GROWTH     Performed at [facility]   Report Status 10/31/2013 FINAL   Final  CULTURE, BLOOD (ROUTINE X 2)     Status: None   Collection Time    10/30/13  2:00 PM      Result Value Range Status   Specimen Description BLOOD RIGHT HAND   Final   Special Requests BOTTLES DRAWN AEROBIC ONLY 8CC   Final   Culture  Setup Time     Final   Value: 10/30/2013 21:17     Performed at [facility]   Culture     Final   Value:        BLOOD CULTURE RECEIVED NO GROWTH TO DATE CULTURE WILL BE HELD FOR 5 DAYS BEFORE ISSUING A FINAL NEGATIVE REPORT     Performed at [facility]   Report Status PENDING   Incomplete  CULTURE, BLOOD (ROUTINE X 2)     Status: None   Collection Time    10/30/13  2:10 PM      Result Value Range Status   Specimen Description BLOOD LEFT ARM   Final   Special Requests BOTTLES DRAWN AEROBIC AND ANAEROBIC R 5CC B 10CC   Final   Culture  Setup Time     Final   Value: 10/30/2013 21:18     Performed at [facility]   Culture     Final   Value:        BLOOD CULTURE RECEIVED NO GROWTH TO DATE CULTURE WILL BE HELD FOR 5 DAYS BEFORE ISSUING A FINAL NEGATIVE REPORT     Performed at [facility]   Report Status PENDING   Incomplete  MRSA PCR SCREENING     Status: Abnormal   Collection Time    10/31/13  2:17 AM      Result Value Range Status   MRSA by PCR POSITIVE (*) NEGATIVE Final   Comment:            The GeneXpert MRSA Assay (FDA     approved for NASAL specimens     only), is one component of a     comprehensive MRSA colonization     surveillance program. It is not     intended to diagnose MRSA     infection nor to guide or     monitor treatment for     MRSA infections.     RESULT CALLED TO, READ BACK BY AND VERIFIED WITH:     J. LINDSAY RN 9:40 10/31/13 (  [email or web address])  CULTURE, BLOOD (ROUTINE X 2)     Status: None   Collection Time    10/31/13  7:55 PM      Result Value Range  Status   Specimen Description BLOOD RIGHT HAND   Final   Special Requests BOTTLES DRAWN AEROBIC ONLY 10CC   Final   Culture  Setup Time     Final   Value: 11/01/2013 01:09     [facility]   Culture     Final   Value:        BLOOD CULTURE RECEIVED NO GROWTH TO DATE CULTURE WILL BE HELD FOR 5 DAYS BEFORE ISSUING A FINAL NEGATIVE REPORT     [facility]   Report Status PENDING   Incomplete  CULTURE, BLOOD (ROUTINE X 2)     Status: None   Collection Time    10/31/13  8:01 PM      Result Value Range Status   Specimen Description BLOOD RIGHT HAND   Final   Special Requests BOTTLES DRAWN AEROBIC ONLY 5CC   Final   Culture  Setup Time     Final   Value: 11/01/2013 01:10     [facility]   Culture     Final   Value:        BLOOD CULTURE RECEIVED NO GROWTH TO DATE CULTURE WILL BE HELD FOR 5 DAYS BEFORE ISSUING A FINAL NEGATIVE REPORT     [facility]   Report Status PENDING   Incomplete     Studies: Dg Chest Port 1 View  10/31/2013   CLINICAL DATA:  Fever  EXAM: PORTABLE CHEST - 1 VIEW  COMPARISON:  Prior radiograph from 10/30/2013  FINDINGS: The cardiac and mediastinal silhouettes are stable in size and contour, and remain within normal limits.  The lungs are mildly hypoinflated. Mild bibasilar linear opacities most likely reflect subsegmental atelectasis. There is mild bronchovascular crowding at the right hilum. No definite focal infiltrate identified. No pulmonary edema or pleural effusion. No pneumothorax.  No acute osseous abnormality identified.  IMPRESSION: Mild hypoinflation with secondary bibasilar atelectasis and/or bronchovascular crowding. No definite focal infiltrates identified.   Electronically Signed   By: Rise MuBenjamin  McClintock M.D.   On: 10/31/2013 20:16    Scheduled Meds: . Chlorhexidine Gluconate Cloth  6 each Topical Q0600  . clopidogrel  75 mg Oral Q breakfast  . docusate  100 mg Oral BID   . enoxaparin (LOVENOX) injection  40 mg Subcutaneous Q24H  . feeding supplement (GLUCERNA SHAKE)  237 mL Oral Q24H  . folic acid  1 mg Oral Daily  . gabapentin  200 mg Oral q morning - 10a  . insulin aspart  0-9 Units Subcutaneous TID WC  . levETIRAcetam  500 mg Oral BID  . mupirocin ointment  1 application Nasal BID  . piperacillin-tazobactam (ZOSYN)  IV  3.375 g Intravenous Q8H  . polyethylene glycol  17 g Oral Daily  . simvastatin  10 mg Oral q1800  . sodium chloride  3 mL Intravenous Q12H  . vancomycin  750 mg Intravenous Q8H   Continuous Infusions: . sodium chloride 75 mL/hr at 11/02/13 [id number]    Principal Problem:   Altered mental status Active Problems:   HTN (hypertension)   CVA (cerebral infarction)   Type II or unspecified type diabetes mellitus without mention of complication, not stated as uncontrolled   Unresponsive   Unresponsiveness   Fever  Time spent: 25 minutes     [facility] C  [facility] Pager [phone]. If 7PM-7AM, please contact night-coverage at [email or web address], password [facility] 11/02/2013, 3:40 PM  LOS: 3 days

## 2013-11-03 DIAGNOSIS — D72829 Elevated white blood cell count, unspecified: Secondary | ICD-10-CM

## 2013-11-03 DIAGNOSIS — R509 Fever, unspecified: Secondary | ICD-10-CM

## 2013-11-03 LAB — GLUCOSE, CAPILLARY
GLUCOSE-CAPILLARY: 156 mg/dL — AB (ref 70–99)
GLUCOSE-CAPILLARY: 159 mg/dL — AB (ref 70–99)
Glucose-Capillary: 151 mg/dL — ABNORMAL HIGH (ref 70–99)

## 2013-11-03 LAB — VANCOMYCIN, TROUGH: Vancomycin Tr: 20.7 ug/mL — ABNORMAL HIGH (ref 10.0–20.0)

## 2013-11-03 MED ORDER — QUETIAPINE FUMARATE 25 MG PO TABS: 12.5000 mg | ORAL_TABLET | Freq: Every day | ORAL | Status: DC

## 2013-11-03 MED ORDER — GABAPENTIN 100 MG PO CAPS: 200.0000 mg | ORAL_CAPSULE | Freq: Every day | ORAL | Status: DC

## 2013-11-03 MED ORDER — GLUCERNA SHAKE PO LIQD: 237.0000 mL | ORAL | Status: AC

## 2013-11-03 MED ORDER — LEVETIRACETAM 500 MG PO TABS: 500.0000 mg | ORAL_TABLET | Freq: Two times a day (BID) | ORAL | Status: AC

## 2013-11-03 MED ORDER — VANCOMYCIN HCL IN DEXTROSE 750-5 MG/150ML-% IV SOLN: 750.0000 mg | Freq: Two times a day (BID) | INTRAVENOUS | Status: DC

## 2013-11-03 MED ORDER — AMOXICILLIN-POT CLAVULANATE 875-125 MG PO TABS: 1.0000 | ORAL_TABLET | Freq: Two times a day (BID) | ORAL | Status: DC

## 2013-11-03 MED ORDER — POTASSIUM CHLORIDE CRYS ER 20 MEQ PO TBCR
40.0000 meq | EXTENDED_RELEASE_TABLET | Freq: Once | ORAL | Status: AC
  Administered 2013-11-03: 40 meq via ORAL
  Filled 2013-11-03: qty 2

## 2013-11-03 MED ORDER — SACCHAROMYCES BOULARDII 250 MG PO CAPS: 250.0000 mg | ORAL_CAPSULE | Freq: Two times a day (BID) | ORAL | Status: AC

## 2013-11-03 MED ORDER — VANCOMYCIN HCL IN DEXTROSE 750-5 MG/150ML-% IV SOLN
750.0000 mg | Freq: Three times a day (TID) | INTRAVENOUS | Status: DC
  Filled 2013-11-03: qty 150

## 2013-11-03 NOTE — Discharge Summary (Signed)
Physician Discharge Summary  Jermaine CoyerWilliam Little RUE:[id number]:[id number] DOB: 12/31/1945 DOA: 10/30/2013  PCP: Terald SleeperOBSON,MICHAEL GAVIN, MD  Admit date: 10/30/2013 Discharge date: 11/03/2013  Time spent: >30 minutes  Recommendations for Outpatient Follow-up:  Follow-up Information   Follow up with Terald SleeperOBSON,MICHAEL GAVIN, MD. (in 1-2days)    Specialty:  Internal Medicine   Contact information:   [address] [place] [address] [phone]       Follow up with Lesly DukesWILLIS,CHARLES KEITH, MD. (in 2-3wks, call for appt upon discharge.)    Specialty:  Neurology   Contact information:   [address] [place] [address] [phone]        Discharge Diagnoses:  Principal Problem: Unresponsiveness/probable seizures Active Problems:   HTN (hypertension)   CVA (cerebral infarction)   Type II or unspecified type diabetes mellitus without mention of complication, not stated as uncontrolled   Fever   Discharge Condition: Improved/stable  Diet recommendation: Modified carb  Filed Weights   10/30/13 1935 11/01/13 2145 11/02/13 2110  Weight: 69.4 kg (153 lb) 74.4 kg (164 lb 0.4 oz) 75 kg (165 lb 5.5 oz)    History of present illness:  [name] is a 68 y.o. male with PMH of HTN, HPL, DM, h/o CVA with Right hemiparesis presented from NH with change in mental status; He is resident of [facility] [place]. Per nursing home staff, he is normally alert and communicative. He is oriented to person only at baseline. They state he was in his normal state of health about 8:00 this morning and when he went back to check on him at 9:00 they noticed him less responsive and drooling out the right side of his mouth.  -no h/o fever, chest pain, no nausea vomiting or diarrhea  -ED MRI head did not show acute stroke; he was evaluated by neurology who recommended observation and EEG r/o seizure   Hospital Course:  1. unresponsiveness/probable seizures  - as discussed above  upon admission patient had  MRI: no acute findings, multiple old CVA occulusions;  -Following admission the patient had another episode of unresponsiveness on 1/5 -Neurology was consulted on admission and Dr. Cyril Mourningamillo saw the patient and was concerned about a possible seizure etiology and patient was loaded with Keppra and started on a maintenance dose. An EEG was done and was read as abnormal EEG with findings suggestive of a mild encephalopathy -no epileptiform activity. -Also patient had been on Seroquel and Neurontin at at bedtime and this were held on admission given his presentation. -I discussed patient today with Dr. Roseanne RenoStewart and reviewed above EEG results as well -and since the patient had had more than one of these episodes he recommends to continue keppra  even though as noted above the EEG did not show epileptiform activity. Patient is to followup with neurology outpatient for further monitoring and management as clinically appropriate. He has not had any further episodes of unresponsiveness since 1/5 as discussed above. -He improved clinically with interventions as above and appears to be at his baseline at this time -his HS dose of Neurontin has been decreased to 200 and also the Seroquel dose decreased to once daily  -He is tp follow up outpatient with nursing home M.D. and with the neurologist as above  2. Hypotension with fever-unclear etiology -While in the hospital patient developed hypotension with SBP 60's; was hypothermic then febrile 1/5; with leukocytosis unclear etiology; CXR: no clear infiltrate; UA unremarkable; blood cultures of 1/4 show no growth to  date ; repeat blood of 1/show no growth to date as well. Influenza panel came back negative the -He was placed on empiric antibiotics with Vanc and Zosyn and has remained afebrile and hemodynamically stable. He'll be discharged on Augmentin to complete a total of 7 days of empiric antibiotics.  3. DM -he was covered with sliding  scale insulin while in the hospital,he is to continue metformin upon discharge  4. H/o CVA with R hemiparesis;  -cont plavix, statin, control BP; snf upon d/c  5. HTN -His blood pressure medications were held due to the episode of hypotension -now resolved as discussed above. His to resume his lisinopril on discharge and follow up outpatient.    -Dr. York Spaniel d/w patient's sister [name] [phone] DPOA; who said that patient is DNR never wanted resuscitation;    Procedures:  EEG Impression: this is an abnormal EEG with findings suggestive of a mild encephalopathy, non specific as to cause.  Clinical correlation is advised.  Wyatt Portela, MD  Consultations:  Neurology  Discharge Exam: Filed Vitals:   11/03/13 1345  BP: 157/97  Pulse: 60  Temp: 97.8 F (36.6 C)  Resp: 18   General: alert and answers appropriately Cardiovascular: s1,s2 rrr  Respiratory: cta BL  Abdomen: soft, nt, nd  Musculoskeletal: no LE edema     Discharge Instructions  Discharge Orders   Future Orders Complete By Expires   Diet Carb Modified  As directed        Medication List         CERTAGEN PO  Take 1 tablet by mouth daily.     clopidogrel 75 MG tablet  Commonly known as:  PLAVIX  Take 75 mg by mouth daily with breakfast.     docusate 50 MG/5ML liquid  Commonly known as:  COLACE  Take 100 mg by mouth 2 (two) times daily.     feeding supplement (GLUCERNA SHAKE) Liqd  Take 237 mLs by mouth daily.     folic acid 1 MG tablet  Commonly known as:  FOLVITE  Take 1 mg by mouth daily.     gabapentin 100 MG capsule  Commonly known as:  NEURONTIN  Take 200 mg by mouth every morning.     gabapentin 100 MG capsule  Commonly known as:  NEURONTIN  Take 2 capsules (200 mg total) by mouth at bedtime.     levETIRAcetam 500 MG tablet  Commonly known as:  KEPPRA  Take 1 tablet (500 mg total) by mouth 2 (two) times daily.     lisinopril 20 MG tablet  Commonly known as:   PRINIVIL,ZESTRIL  Take 20 mg by mouth daily.     metFORMIN 1000 MG tablet  Commonly known as:  GLUCOPHAGE  Take 1,000 mg by mouth 2 (two) times daily with a meal.     polyethylene glycol packet  Commonly known as:  MIRALAX / GLYCOLAX  Take 17 g by mouth daily.     pravastatin 20 MG tablet  Commonly known as:  PRAVACHOL  Take 20 mg by mouth daily.     QUEtiapine 25 MG tablet  Commonly known as:  SEROQUEL  Take 0.5 tablets (12.5 mg total) by mouth at bedtime.      Augmentin 875 mg 1tab by mouth twice a day Florastor 250 mg 1 tablet by mouth twice a day    No Known Allergies     Follow-up Information   Follow up with Terald Sleeper, MD. (in 1-2days)    Specialty:  Internal Medicine   Contact information:   [address] [place] [address] [phone]       Follow up with Lesly Dukes, MD. (in 2-3wks, call for appt upon discharge.)    Specialty:  Neurology   Contact information:   [address] [place] [address] [phone]        The results of significant diagnostics from this hospitalization (including imaging, microbiology, ancillary and laboratory) are listed below for reference.    Significant Diagnostic Studies: Ct Head Wo Contrast  10/30/2013   CLINICAL DATA:  Mental status change with acute episode of unresponsiveness. Code stroke. History of stroke.  EXAM: CT HEAD WITHOUT CONTRAST  TECHNIQUE: Contiguous axial images were obtained from the base of the skull through the vertex without intravenous contrast.  COMPARISON:  None.  FINDINGS: There is right parietal encephalomalacia consistent with an old right MCA stroke. Atrophy and periventricular white matter disease are noted. There is no evidence of acute intracranial hemorrhage, mass lesion, brain edema or extra-axial fluid collection. There is no hydrocephalus.  The visualized paranasal sinuses, mastoid air cells and middle ears are clear. The calvarium is intact.   IMPRESSION: 1. No evidence of acute intracranial hemorrhage or acute stroke. 2. Old right MCA distribution infarct with atrophy and chronic small vessel ischemic changes. 3. These results were called by telephone at the time of interpretation on 10/30/2013 at 11:00 AM to Dr. Shawna Orleans BELFI , who verbally acknowledged these results.   Electronically Signed   By: Roxy Horseman M.D.   On: 10/30/2013 11:00   Mr Maxine Glenn Head Wo Contrast  10/30/2013   CLINICAL DATA:  Acute onset of unresponsiveness.  EXAM: MRI HEAD WITHOUT CONTRAST  MRA HEAD WITHOUT CONTRAST  TECHNIQUE: Multiplanar, multiecho pulse sequences of the brain and surrounding structures were obtained without intravenous contrast. Angiographic images of the head were obtained using MRA technique without contrast.  COMPARISON:  Head CT earlier the same day  FINDINGS: MRI HEAD FINDINGS  There is no evidence of acute infarct. Right frontoparietal encephalomalacia is consistent with remote MCA infarct. Encephalomalacia/atrophy is noted involving the left cerebral peduncle. Small, old cerebellar infarcts are noted, right greater than left. Periventricular white matter T2 hyperintensities are compatible with moderate chronic small vessel ischemic disease. There is moderate cerebral atrophy, mildly advanced for age. Remote microhemorrhages are noted in the right greater than left cerebellar hemispheres as well as left basal ganglia. There is no evidence of mass, midline shift, or extra-axial fluid collection. Orbits are unremarkable. Major intracranial vascular flow voids are unremarkable. Visualized paranasal sinuses and mastoid air cells are clear. Calvarium is normal in signal.  MRA HEAD FINDINGS  Images are mildly degraded by motion artifact. Visualized distal left vertebral artery is patent and dominant, demonstrating mild diffuse irregularity. The distal right vertebral artery is not well identified. Basilar artery is patent with mild irregularity but no evidence of  high-grade stenosis. Left PICA appears patent, although its origin is not well evaluated. SCAs are patent bilaterally. Right PCA origin is patent. Left P1 segment appears either occluded or congenitally hypoplastic/aplastic. There is a small left posterior communicating artery. Minimal, intermittent flow is identified in left PCA branches more distally. There is mild to moderate irregularity of the right PCA.  Internal carotid arteries are patent from skullbase to carotid terminus. The left intracranial internal carotid artery appears mildly dominant compared to the right. There is mild, diffuse narrowing and irregularity of the left carotid siphon and supraclinoid left ICA.  The right A1 segment is markedly hypoplastic. Right A2 segment is predominantly supplied via the anterior communicating artery. The left ACA is unremarkable. MCA origins are patent bilaterally. Visualized left MCA branches are patent with mild irregularity. Right MCA trifurcation is patent, however there is occlusion of small distal branches likely related to prior infarct. There also a few areas of apparent moderate focal stenosis involving proximal M2 branches on the right. No intracranial aneurysm is identified.  IMPRESSION: 1. No evidence of acute infarct or other acute intracranial abnormality. 2. Remote right MCA and bilateral cerebellar infarcts. 3. Mild to moderate irregularity and narrowing of right greater than left MCA branches consistent with atherosclerosis. 4. Occlusion of distal right MCA branches, likely related to prior infarct. 5. Mild to moderate posterior circulation irregularity, consistent with atherosclerosis. Left P1 segment appears either occluded or congenitally aplastic, with minimal flow in more distal left PCA branches. 6. 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   Electronically Signed   By: Sebastian Ache   On:  10/30/2013 12:30   Mr Brain Wo Contrast  10/30/2013   CLINICAL DATA:  Acute onset of unresponsiveness.  EXAM: MRI HEAD WITHOUT CONTRAST  MRA HEAD WITHOUT CONTRAST  TECHNIQUE: Multiplanar, multiecho pulse sequences of the brain and surrounding structures were obtained without intravenous contrast. Angiographic images of the head were obtained using MRA technique without contrast.  COMPARISON:  Head CT earlier the same day  FINDINGS: MRI HEAD FINDINGS  There is no evidence of acute infarct. Right frontoparietal encephalomalacia is consistent with remote MCA infarct. Encephalomalacia/atrophy is noted involving the left cerebral peduncle. Small, old cerebellar infarcts are noted, right greater than left. Periventricular white matter T2 hyperintensities are compatible with moderate chronic small vessel ischemic disease. There is moderate cerebral atrophy, mildly advanced for age. Remote microhemorrhages are noted in the right greater than left cerebellar hemispheres as well as left basal ganglia. There is no evidence of mass, midline shift, or extra-axial fluid collection. Orbits are unremarkable. Major intracranial vascular flow voids are unremarkable. Visualized paranasal sinuses and mastoid air cells are clear. Calvarium is normal in signal.  MRA HEAD FINDINGS  Images are mildly degraded by motion artifact. Visualized distal left vertebral artery is patent and dominant, demonstrating mild diffuse irregularity. The distal right vertebral artery is not well identified. Basilar artery is patent with mild irregularity but no evidence of high-grade stenosis. Left PICA appears patent, although its origin is not well evaluated. SCAs are patent bilaterally. Right PCA origin is patent. Left P1 segment appears either occluded or congenitally hypoplastic/aplastic. There is a small left posterior communicating artery. Minimal, intermittent flow is identified in left PCA branches more distally. There is mild to moderate  irregularity of the right PCA.  Internal carotid arteries are patent from skullbase to carotid terminus. The left intracranial internal carotid artery appears mildly dominant compared to the right. There is mild, diffuse narrowing and irregularity of the left carotid siphon and supraclinoid left ICA. The right A1 segment is markedly hypoplastic. Right A2 segment is predominantly supplied via the anterior communicating artery. The left ACA is unremarkable. MCA origins are patent bilaterally. Visualized left MCA branches are patent with mild irregularity. Right MCA trifurcation is patent, however there is occlusion of small distal branches likely related to prior infarct. There also a few areas of apparent moderate focal stenosis involving proximal M2 branches on the right. No intracranial aneurysm is identified.  IMPRESSION: 1. No evidence of  acute infarct or other acute intracranial abnormality. 2. Remote right MCA and bilateral cerebellar infarcts. 3. Mild to moderate irregularity and narrowing of right greater than left MCA branches consistent with atherosclerosis. 4. Occlusion of distal right MCA branches, likely related to prior infarct. 5. Mild to moderate posterior circulation irregularity, consistent with atherosclerosis. Left P1 segment appears either occluded or congenitally aplastic, with minimal flow in more distal left PCA branches. 6. 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   Electronically Signed   By: Sebastian Ache   On: 10/30/2013 12:30   Dg Chest Port 1 View  10/31/2013   CLINICAL DATA:  Fever  EXAM: PORTABLE CHEST - 1 VIEW  COMPARISON:  Prior radiograph from 10/30/2013  FINDINGS: The cardiac and mediastinal silhouettes are stable in size and contour, and remain within normal limits.  The lungs are mildly hypoinflated. Mild bibasilar linear opacities most likely reflect subsegmental atelectasis.  There is mild bronchovascular crowding at the right hilum. No definite focal infiltrate identified. No pulmonary edema or pleural effusion. No pneumothorax.  No acute osseous abnormality identified.  IMPRESSION: Mild hypoinflation with secondary bibasilar atelectasis and/or bronchovascular crowding. No definite focal infiltrates identified.   Electronically Signed   By: Rise Mu M.D.   On: 10/31/2013 20:16   Dg Chest Port 1 View  10/30/2013   CLINICAL DATA:  Shortness of breath.  Altered mental status  EXAM: PORTABLE CHEST - 1 VIEW  COMPARISON:  None  FINDINGS: Difficult positioning required holding the patient during imaging. The costophrenic angles were excluded.  Old right rib deformity from prior fracture. The lungs appear clear. Cardiac and mediastinal margins appear normal.  IMPRESSION: 1. No acute thoracic findings.   Electronically Signed   By: Herbie Baltimore M.D.   On: 10/30/2013 12:58    Microbiology: Recent Results (from the past 240 hour(s))  URINE CULTURE     Status: None   Collection Time    10/30/13 12:23 PM      Result Value Range Status   Specimen Description URINE, CLEAN CATCH   Final   Special Requests NONE   Final   Culture  Setup Time     Final   Value: 10/31/2013 01:31     Performed at [facility] Count     Final   Value: NO GROWTH     Performed at [facility]   Culture     Final   Value: NO GROWTH     Performed at [facility]   Report Status 10/31/2013 FINAL   Final  CULTURE, BLOOD (ROUTINE X 2)     Status: None   Collection Time    10/30/13  2:00 PM      Result Value Range Status   Specimen Description BLOOD RIGHT HAND   Final   Special Requests BOTTLES DRAWN AEROBIC ONLY 8CC   Final   Culture  Setup Time     Final   Value: 10/30/2013 21:17     Performed at [facility]   Culture     Final   Value:        BLOOD CULTURE RECEIVED NO GROWTH TO DATE CULTURE WILL BE HELD FOR 5 DAYS BEFORE ISSUING A FINAL  NEGATIVE REPORT     Performed at [facility]   Report Status PENDING   Incomplete  CULTURE, BLOOD (ROUTINE X 2)     Status: None  Collection Time    10/30/13  2:10 PM      Result Value Range Status   Specimen Description BLOOD LEFT ARM   Final   Special Requests BOTTLES DRAWN AEROBIC AND ANAEROBIC R 5CC B 10CC   Final   Culture  Setup Time     Final   Value: 10/30/2013 21:18     Performed at [facility]   Culture     Final   Value:        BLOOD CULTURE RECEIVED NO GROWTH TO DATE CULTURE WILL BE HELD FOR 5 DAYS BEFORE ISSUING A FINAL NEGATIVE REPORT     Performed at [facility]   Report Status PENDING   Incomplete  MRSA PCR SCREENING     Status: Abnormal   Collection Time    10/31/13  2:17 AM      Result Value Range Status   MRSA by PCR POSITIVE (*) NEGATIVE Final   Comment:            The GeneXpert MRSA Assay (FDA     approved for NASAL specimens     only), is one component of a     comprehensive MRSA colonization     surveillance program. It is not     intended to diagnose MRSA     infection nor to guide or     monitor treatment for     MRSA infections.     RESULT CALLED TO, READ BACK BY AND VERIFIED WITH:     J. LINDSAY RN 9:40 10/31/13 ([email or web address])  CULTURE, BLOOD (ROUTINE X 2)     Status: None   Collection Time    10/31/13  7:55 PM      Result Value Range Status   Specimen Description BLOOD RIGHT HAND   Final   Special Requests BOTTLES DRAWN AEROBIC ONLY 10CC   Final   Culture  Setup Time     Final   Value: 11/01/2013 01:09     Performed at [facility]   Culture     Final   Value:        BLOOD CULTURE RECEIVED NO GROWTH TO DATE CULTURE WILL BE HELD FOR 5 DAYS BEFORE ISSUING A FINAL NEGATIVE REPORT     Performed at [facility]   Report Status PENDING   Incomplete  CULTURE, BLOOD (ROUTINE X 2)     Status: None   Collection Time    10/31/13  8:01 PM      Result Value Range Status   Specimen Description BLOOD RIGHT HAND    Final   Special Requests BOTTLES DRAWN AEROBIC ONLY 5CC   Final   Culture  Setup Time     Final   Value: 11/01/2013 01:10     Performed at [facility]   Culture     Final   Value:        BLOOD CULTURE RECEIVED NO GROWTH TO DATE CULTURE WILL BE HELD FOR 5 DAYS BEFORE ISSUING A FINAL NEGATIVE REPORT     Performed at [facility]   Report Status PENDING   Incomplete     Labs: Basic Metabolic Panel:  Recent Labs Lab 10/30/13 1041 10/30/13 1045 11/02/13 0425  NA 140 142 142  K 3.8 3.7 3.2*  CL 103 104 107  CO2 24  --  20  GLUCOSE 155* 161* 145*  BUN 15 16 8   CREATININE 0.63 0.70 0.73  CALCIUM 9.0  --  8.3*  Liver Function Tests:  Recent Labs Lab 10/30/13 1041  AST 12  ALT 13  ALKPHOS 52  BILITOT 0.3  PROT 7.2  ALBUMIN 3.9   No results found for this basename: LIPASE, AMYLASE,  in the last 168 hours No results found for this basename: AMMONIA,  in the last 168 hours CBC:  Recent Labs Lab 10/30/13 1041 10/30/13 1045 11/01/13 0530 11/02/13 0425  WBC 10.2  --  12.7* 12.2*  NEUTROABS 7.0  --   --   --   HGB 14.0 14.6 11.8* 11.5*  HCT 41.1 43.0 35.4* 34.4*  MCV 91.3  --  92.4 91.5  PLT 234  --  222 243   Cardiac Enzymes:  Recent Labs Lab 10/30/13 1041  TROPONINI <0.30   BNP: BNP (last 3 results) No results found for this basename: PROBNP,  in the last 8760 hours CBG:  Recent Labs Lab 11/02/13 1147 11/02/13 1728 11/02/13 2102 11/03/13 0737 11/03/13 1226  GLUCAP 189* 121* 215* 151* 156*       Signed:  Luisana Lutzke C  [facility] 11/03/2013, 2:58 PM

## 2013-11-03 NOTE — Clinical Social Work Note (Signed)
Patient medically stable for discharge back to [facility]. SNF admissions staff contacted and informed and discharge summary transmitted to facility. Patient's sister contacted and advised of patient's readiness for discharge. Patient will be transported to facility by ambulance.  Genelle BalVanessa Kaeleb Emond, MSW, LCSW [phone]

## 2013-11-03 NOTE — Progress Notes (Signed)
[facility] IV d/c'd. Skin intact except as most recently charted. Vitals are stable. Report called to Onalee Huaavid at receiving facility. Pt to be transported by ambulance service.  Peri MarisAndrew Tracye Szuch, MBA, BS, RN

## 2013-11-03 NOTE — Care Management Note (Signed)
   CARE MANAGEMENT NOTE 11/03/2013  Patient:  [name],[name]   Account Number:  [id number]  Date Initiated:  10/31/2013  Documentation initiated by:  Donn PieriniWEBSTER,KRISTI  Subjective/Objective Assessment:   Pt admitted with AMS     Action/Plan:   PTA [facility] lived [facility]- CSW consulted for placement needs   Anticipated DC Date:  11/03/2013   Anticipated DC Plan:  SKILLED NURSING FACILITY  In-house referral  Clinical Social Worker      DC Planning Services  CM consult      Choice offered to / List presented to:             Status of service:  Completed, signed off Medicare Important Message given?   (If response is "NO", the following Medicare IM given date fields will be blank) Date Medicare IM given:   Date Additional Medicare IM given:    Discharge Disposition:    Per UR Regulation:  Reviewed for med. necessity/level of care/duration of stay  If discussed at Long Length of Stay Meetings, dates discussed:    Comments:  11/03/13 Pt for d/[facility] back [facility] today. Johny Shockheryl Kemiyah Tarazon RN MPH, case manager, [phone]

## 2013-11-03 NOTE — Progress Notes (Addendum)
ANTIBIOTIC CONSULT NOTE  Pharmacy Consult for Zosyn, Vancomycin Indication: Sepsis?  No Known Allergies  Labs:  Recent Labs  11/01/13 0530 11/02/13 0425  WBC 12.7* 12.2*  HGB 11.8* 11.5*  PLT 222 243  CREATININE  --  0.73   Estimated Creatinine Clearance: 87.9 ml/min (by C-G formula based on Cr of 0.73).  Recent Labs  11/03/13 0920  VANCOTROUGH 20.7*     Assessment: 68 yr old male was brought in [facility] [place] a code stroke. His PMH includes DM, Hyperlipidemia and CVAx2. He was later made a code sepsis. Vancomycin and Zosyn continues for sepsis. CT and MRI showed no evidence of acute stroke.   Cultures negative Vancomycin trough slightly supra-therapeutic at 20.7  Goal of Therapy:  Vancomycin trough level 15-20 mcg/ml  Plan:  Continue Zosyn 3.375 IV q8h  Decrease Vancomycin to 750 mg iv Q 12 hours  May we discontinue / narrow antibiotic therapy?  Thank you. Okey RegalLisa Khair Chasteen, PharmD [phone]  11/03/2013,10:45 AM

## 2013-11-03 NOTE — Clinical Social Work Psychosocial (Signed)
Clinical Social Work Department BRIEF PSYCHOSOCIAL ASSESSMENT 11/03/2013  Patient:  Thurman CoyerHARRELL,Reymundo     Account Number:  [id number]     Admit date:  10/30/2013  Clinical Social Worker:  Delmer IslamRAWFORD,Orpah Hausner, LCSW  Date/Time:  11/03/2013 04:18 AM  Referred by:  Physician  Date Referred:  11/02/2013 Referred for  SNF Placement   Other Referral:   Interview type:  Family Other interview type:   CSW talked with patient's sister Sonny Mastersnn Jarvis [phone]    PSYCHOSOCIAL DATA Living Status:  FACILITY Admitted from facility[facility] Level of care:  Skilled Nursing Facility Primary support name:  Sonny Mastersnn Jarvis Primary support relationship to patient:  S[place] Degree of support available:    CURRENT CONCERNS Current Concerns  Post-Acute Placement   Other Concerns:    SOCIAL WORK ASSESSMENT / PLAN CSW talked with patient's sister by phone to advise of patient's readiness for discharge and she was in agreement with patient returning to [facility].   Assessment/plan status:  No Further Intervention Required Other assessment/ plan:   Information/referral to community resources:    PATIENT'S/FAMILY'S RESPONSE TO PLAN OF CARE: Ms. Jason NestJarvis was appreciative of CSW's call to inform her of discharge.

## 2013-11-05 LAB — CULTURE, BLOOD (ROUTINE X 2)
CULTURE: NO GROWTH
CULTURE: NO GROWTH

## 2013-11-07 ENCOUNTER — Non-Acute Institutional Stay (SKILLED_NURSING_FACILITY): Payer: Medicare Other | Admitting: Internal Medicine

## 2013-11-07 DIAGNOSIS — R569 Unspecified convulsions: Secondary | ICD-10-CM

## 2013-11-07 DIAGNOSIS — I6350 Cerebral infarction due to unspecified occlusion or stenosis of unspecified cerebral artery: Secondary | ICD-10-CM

## 2013-11-07 DIAGNOSIS — I639 Cerebral infarction, unspecified: Secondary | ICD-10-CM

## 2013-11-07 DIAGNOSIS — E1159 Type 2 diabetes mellitus with other circulatory complications: Secondary | ICD-10-CM

## 2013-11-07 DIAGNOSIS — I10 Essential (primary) hypertension: Secondary | ICD-10-CM

## 2013-11-07 LAB — CULTURE, BLOOD (ROUTINE X 2)
CULTURE: NO GROWTH
Culture: NO GROWTH

## 2013-11-07 NOTE — Progress Notes (Signed)
Patient ID: [name], male   DOB: 12-23-1945, 68 y.o.   MRN: [id number] Facility; [facility] Chief complaint; review of medical issues and recent hospitalization History; this is a patient who is a long-standing resident in this building. For one reason or another it does not seem that I have actually seen this patient although he has been seen by our service. It would appear that he has been in the building since 2004 as a consequence of multiple CVAs., Chronic small vessel disease on MRI, a right cerebellar stroke in 9/25.   He was sent to the hospital this time with altered level of consciousness right facial droop. He was seen in consultation by neurology. His MRI/MRA as listed below. He did not have an acute stroke. He also underwent an EEG that did not show definitive evidence of an epileptic focus. He was at one point bloated with Keppra subsequently became hypotensive and bradycardic requiring fluid etc. I did not actually see the result of this as he remains on Keppra now          Procedures    1. EEG ADULT [NEU1004]         EEG report.   Brief clinical history: 68 y/o with ristory right MCA distribution stroke admitted with altered mental status.   Technique: this is a 17 channel routie  scalp EEG performed at the bedside with bipolar and monopolar montages arranged in accordance to the international 10/20 system of electrode placement. One channel was dedicated to EKG recording.   No activating procedures performed.   Description: as the study begins, there is diffuse, continuous 7-8 Hz activity that subsequently seems to be replaced by a normal sleep pattern. No evidence of electrographic seizures.   No focal or generalized epileptiform discharges noted.   EKG showed sinus rhythm.   Impression: this is an abnormal EEG with findings suggestive of a mild encephalopathy, non specific as to cause. Clinical correlation is advised.   Wyatt Portela, MD   FINDINGS: MRI HEAD  FINDINGS   There is no evidence of acute infarct. Right frontoparietal encephalomalacia is consistent with remote MCA infarct. Encephalomalacia/atrophy is noted involving the left cerebral peduncle. Small, old cerebellar infarcts are noted, right greater than left. Periventricular white matter T2 hyperintensities are compatible with moderate chronic small vessel ischemic disease. There is moderate cerebral atrophy, mildly advanced for age. Remote microhemorrhages are noted in the right greater than left cerebellar hemispheres as well as left basal ganglia. There is no evidence of mass, midline shift, or extra-axial fluid collection. Orbits are unremarkable. Major intracranial vascular flow voids are unremarkable. Visualized paranasal sinuses and mastoid air cells are clear. Calvarium is normal in signal.   MRA HEAD FINDINGS   Images are mildly degraded by motion artifact. Visualized distal left vertebral artery is patent and dominant, demonstrating mild diffuse irregularity. The distal right vertebral artery is not well identified. Basilar artery is patent with mild irregularity but no evidence of high-grade stenosis. Left PICA appears patent, although its origin is not well evaluated. SCAs are patent bilaterally. Right PCA origin is patent. Left P1 segment appears either occluded or congenitally hypoplastic/aplastic. There is a small left posterior communicating artery. Minimal, intermittent flow is identified in left PCA branches more distally. There is mild to moderate irregularity of the right PCA.   Internal carotid arteries are patent from skullbase to carotid terminus. The left intracranial internal carotid artery appears mildly dominant compared to the right. There is mild, diffuse narrowing and  irregularity of the left carotid siphon and supraclinoid left ICA. The right A1 segment is markedly hypoplastic. Right A2 segment is predominantly supplied via the  anterior communicating artery. The left ACA is unremarkable. MCA origins are patent bilaterally. Visualized left MCA branches are patent with mild irregularity. Right MCA trifurcation is patent, however there is occlusion of small distal branches likely related to prior infarct. There also a few areas of apparent moderate focal stenosis involving proximal M2 branches on the right. No intracranial aneurysm is identified.   IMPRESSION: 1. No evidence of acute infarct or other acute intracranial abnormality. 2. Remote right MCA and bilateral cerebellar infarcts. 3. Mild to moderate irregularity and narrowing of right greater than left MCA branches consistent with atherosclerosis. 4. Occlusion of distal right MCA branches, likely related to prior infarct. 5. Mild to moderate posterior circulation irregularity, consistent with atherosclerosis. Left P1 segment appears either occluded or congenitally aplastic, with minimal flow in more distal left PCA branches. 6. 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     Electronically Signed   By: Sebastian AcheAllen  Grady   On: 10/30/2013 12:30 MRA HEAD WITHOUT CONTRAST   TECHNIQUE: Multiplanar, multiecho pulse sequences of the brain and surrounding structures were obtained without intravenous contrast. Angiographic images of the head were obtained using MRA technique without contrast.   COMPARISON:  Head CT earlier the same day   FINDINGS: MRI HEAD FINDINGS   There is no evidence of acute infarct. Right frontoparietal encephalomalacia is consistent with remote MCA infarct. Encephalomalacia/atrophy is noted involving the left cerebral peduncle. Small, old cerebellar infarcts are noted, right greater than left. Periventricular white matter T2 hyperintensities are compatible with moderate chronic small vessel ischemic disease. There is moderate cerebral  atrophy, mildly advanced for age. Remote microhemorrhages are noted in the right greater than left cerebellar hemispheres as well as left basal ganglia. There is no evidence of mass, midline shift, or extra-axial fluid collection. Orbits are unremarkable. Major intracranial vascular flow voids are unremarkable. Visualized paranasal sinuses and mastoid air cells are clear. Calvarium is normal in signal.   MRA HEAD FINDINGS   Images are mildly degraded by motion artifact. Visualized distal left vertebral artery is patent and dominant, demonstrating mild diffuse irregularity. The distal right vertebral artery is not well identified. Basilar artery is patent with mild irregularity but no evidence of high-grade stenosis. Left PICA appears patent, although its origin is not well evaluated. SCAs are patent bilaterally. Right PCA origin is patent. Left P1 segment appears either occluded or congenitally hypoplastic/aplastic. There is a small left posterior communicating artery. Minimal, intermittent flow is identified in left PCA branches more distally. There is mild to moderate irregularity of the right PCA.   Internal carotid arteries are patent from skullbase to carotid terminus. The left intracranial internal carotid artery appears mildly dominant compared to the right. There is mild, diffuse narrowing and irregularity of the left carotid siphon and supraclinoid left ICA. The right A1 segment is markedly hypoplastic. Right A2 segment is predominantly supplied via the anterior communicating artery. The left ACA is unremarkable. MCA origins are patent bilaterally. Visualized left MCA branches are patent with mild irregularity. Right MCA trifurcation is patent, however there is occlusion of small distal branches likely related to prior infarct. There also a few areas of apparent moderate focal stenosis involving proximal M2 branches on the right. No intracranial aneurysm is identified.    IMPRESSION: 1. No evidence of acute infarct  or other acute intracranial abnormality. 2. Remote right MCA and bilateral cerebellar infarcts. 3. Mild to moderate irregularity and narrowing of right greater than left MCA branches consistent with atherosclerosis. 4. Occlusion of distal right MCA branches, likely related to prior infarct. 5. Mild to moderate posterior circulation irregularity, consistent with atherosclerosis. Left P1 segment appears either occluded or congenitally aplastic, with minimal flow in more distal left PCA branches. 6. 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     Electronically Signed   By: Sebastian Ache   On: 10/30/2013 12:30  Current Outpatient Prescriptions on File Prior to Visit  Medication Sig Dispense Refill  . amoxicillin-clavulanate (AUGMENTIN) 875-125 MG per tablet Take 1 tablet by mouth 2 (two) times daily.      . clopidogrel (PLAVIX) 75 MG tablet Take 75 mg by mouth daily with breakfast.      . docusate (COLACE) 50 MG/5ML liquid Take 100 mg by mouth 2 (two) times daily.      . feeding supplement, GLUCERNA SHAKE, (GLUCERNA SHAKE) LIQD Take 237 mLs by mouth daily.    0  . folic acid (FOLVITE) 1 MG tablet Take 1 mg by mouth daily.      Marland Kitchen gabapentin (NEURONTIN) 100 MG capsule Take 200 mg by mouth every morning.      . gabapentin (NEURONTIN) 100 MG capsule Take 2 capsules (200 mg total) by mouth at bedtime.      . levETIRAcetam (KEPPRA) 500 MG tablet Take 1 tablet (500 mg total) by mouth 2 (two) times daily.      Marland Kitchen lisinopril (PRINIVIL,ZESTRIL) 20 MG tablet Take 20 mg by mouth daily.      . metFORMIN (GLUCOPHAGE) 1000 MG tablet Take 1,000 mg by mouth 2 (two) times daily with a meal.      . Multiple Vitamins-Minerals (CERTAGEN PO) Take 1 tablet by mouth daily.      . polyethylene glycol (MIRALAX / GLYCOLAX) packet Take 17 g by mouth daily.      . pravastatin  (PRAVACHOL) 20 MG tablet Take 20 mg by mouth daily.      . QUEtiapine (SEROQUEL) 25 MG tablet Take 0.5 tablets (12.5 mg total) by mouth at bedtime.      . saccharomyces boulardii (FLORASTOR) 250 MG capsule Take 1 capsule (250 mg total) by mouth 2 (two) times daily.       No current facility-administered medications on file prior to visit.     Past Medical History  Diagnosis Date  . Hyperlipemia   . Diabetes   . Hemiplegia   . Vertebral artery occlusion   . Substance abuse   . Hypertension   . Cerebral artery occlusion with cerebral infarction 08/22/2011   Social History: nothing on his record. No Advanced directives.  Review of systems; HEENT patient does not complain of headache Cardiac no chest pain Respiratory no shortness of breath Neurologic; the patient is a 2 person assist transfer. Appears to be doubly incontinent  Physical exam Gen. take it patient is awake and responsive Vitals O2 sat 95% on room air respirations 18 pulse 80 and regular Respiratory clear entry bilaterally Cardiac heart sounds are normal there is no murmurs no carotid bruits Abdomen no liver no spleen no tenderness GU no suprapubic or costovertebral angle tenderness Neurologic; no visual field problems. Keeps his right arm in a flexed position. Otherwise he appears to have function of both legs and his left arm Mental status somewhat flat  affect. 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

## 2013-11-23 ENCOUNTER — Ambulatory Visit: Payer: Medicare Other | Admitting: Neurology

## 2013-11-25 ENCOUNTER — Telehealth: Payer: Self-pay | Admitting: Neurology

## 2013-11-25 ENCOUNTER — Ambulatory Visit: Payer: Medicare Other | Admitting: Neurology

## 2013-11-25 NOTE — Telephone Encounter (Signed)
The patient was sick today, and he did not show for his appointment.

## 2013-11-25 NOTE — Telephone Encounter (Signed)
resched today's appt  (1:30), patient has a virus

## 2013-12-01 ENCOUNTER — Ambulatory Visit: Payer: Self-pay | Admitting: Neurology

## 2013-12-14 ENCOUNTER — Ambulatory Visit: Payer: Self-pay | Admitting: Neurology

## 2013-12-19 ENCOUNTER — Ambulatory Visit (INDEPENDENT_AMBULATORY_CARE_PROVIDER_SITE_OTHER): Payer: Medicare Other | Admitting: Neurology

## 2013-12-19 ENCOUNTER — Encounter: Payer: Self-pay | Admitting: Neurology

## 2013-12-19 ENCOUNTER — Telehealth: Payer: Self-pay | Admitting: Neurology

## 2013-12-19 ENCOUNTER — Encounter (INDEPENDENT_AMBULATORY_CARE_PROVIDER_SITE_OTHER): Payer: Self-pay

## 2013-12-19 VITALS — BP 101/70 | HR 57

## 2013-12-19 DIAGNOSIS — R4182 Altered mental status, unspecified: Secondary | ICD-10-CM

## 2013-12-19 DIAGNOSIS — D518 Other vitamin B12 deficiency anemias: Secondary | ICD-10-CM

## 2013-12-19 DIAGNOSIS — I639 Cerebral infarction, unspecified: Secondary | ICD-10-CM

## 2013-12-19 DIAGNOSIS — I6350 Cerebral infarction due to unspecified occlusion or stenosis of unspecified cerebral artery: Secondary | ICD-10-CM

## 2013-12-19 NOTE — Progress Notes (Signed)
Reason for visit: Altered mental status  [name] is a 68 y.o. male  History of present illness:  [name] is a 68 year old right-handed white male with a history of cerebrovascular disease. The patient has been in an extended care facility since 2004 following a large right brain stroke. The patient has been nonambulatory, but he has been verbal, and able to assist with transfers. The patient was [facility] 10/30/2013 with a sudden change in mental status. The patient was found to be unresponsive, drooling. The patient underwent a workup that included MRI evaluation of the brain that did not show acute ischemic changes. These images were reviewed on line. The patient underwent an EEG study that did not clearly show evidence of epileptiform discharges. The patient was presumed to have seizures, and he was placed on Keppra. The patient however, has never returned to his baseline. The patient remains minimally verbal. The patient is not using his right arm as well as the left. The patient will feed himself, but otherwise requires much more assistance with transfers, and he requires assistance with bathing and dressing. The patient complains of chronic low back pain, no other new complaints are noted. The patient is sent to this office for further evaluation. The patient remains on Keppra.  Past Medical History  Diagnosis Date  . Hyperlipemia   . Diabetes   . Hemiplegia   . Vertebral artery occlusion   . Substance abuse   . Hypertension   . Cerebral artery occlusion with cerebral infarction 08/22/2011    History reviewed. No pertinent past surgical history.  Family History  Problem Relation Age of Onset  . Family history unknown: Yes    Social history:  reports that he has quit smoking. He has never used smokeless tobacco. He reports that he uses illicit drugs. He reports that he does not drink alcohol.  Medications:  Current Outpatient Prescriptions  on File Prior to Visit  Medication Sig Dispense Refill  . clopidogrel (PLAVIX) 75 MG tablet Take 75 mg by mouth daily with breakfast.      . docusate (COLACE) 50 MG/5ML liquid Take 100 mg by mouth 2 (two) times daily.      . feeding supplement, GLUCERNA SHAKE, (GLUCERNA SHAKE) LIQD Take 237 mLs by mouth daily.    0  . folic acid (FOLVITE) 1 MG tablet Take 1 mg by mouth daily.      Marland Kitchen. gabapentin (NEURONTIN) 100 MG capsule Take 200 mg by mouth every morning.      . gabapentin (NEURONTIN) 100 MG capsule Take 2 capsules (200 mg total) by mouth at bedtime.      . levETIRAcetam (KEPPRA) 500 MG tablet Take 1 tablet (500 mg total) by mouth 2 (two) times daily.      Marland Kitchen. lisinopril (PRINIVIL,ZESTRIL) 20 MG tablet Take 20 mg by mouth daily.      . metFORMIN (GLUCOPHAGE) 1000 MG tablet Take 1,000 mg by mouth 2 (two) times daily with a meal.      . Multiple Vitamins-Minerals (CERTAGEN PO) Take 1 tablet by mouth daily.      . polyethylene glycol (MIRALAX / GLYCOLAX) packet Take 17 g by mouth daily.      . pravastatin (PRAVACHOL) 20 MG tablet Take 20 mg by mouth daily.      . QUEtiapine (SEROQUEL) 25 MG tablet Take 0.5 tablets (12.5 mg total) by mouth at bedtime.       No current facility-administered medications on file  prior to visit.     No Known Allergies  ROS:  Out of a complete 14 system review of symptoms, the patient complains only of the following symptoms, and all other reviewed systems are negative.  Chronic low back pain Altered mental status  Blood pressure 101/70, pulse 57, weight 0 lb (0 kg).  Physical Exam  General: The patient is alert, but he is minimally verbal and he will not follow commands consistently for the examination.  Eyes: Pupils are equal, round, and reactive to light. Discs could not be adequately assess secondary to poor patient cooperation.  Neck: The neck is supple, no carotid bruits are noted.  Respiratory: The respiratory examination is  clear.  Cardiovascular: The cardiovascular examination reveals a regular rate and rhythm, no obvious murmurs or rubs are noted.  Skin: Extremities are without significant edema.  Neurologic Exam  Mental status: The patient is alert, oriented to name, will not consistently answer questions for mental status evaluation.  Cranial nerves: Facial symmetry is present. There is good sensation of the face to pinprick and soft touch bilaterally. The strength of the facial muscles and the muscles to head turning and shoulder shrug are normal bilaterally. Speech is well enunciated, no aphasia or dysarthria is noted. Extraocular movements are full. Visual fields are full to threat. The tongue is midline, and the patient has symmetric elevation of the soft palate. No obvious hearing deficits are noted.  Motor: The motor testing reveals that the fingers are in flexion of the right hand. The patient has more spontaneous movement on the left arm and the right. The patient will drift of both arms down to the chair with elevation. The patient does not follow commands well enough to fully assess motor strength on the arms or legs. There is some increased motor tone on the left leg as compared to the right.  Sensory: Sensory testing is intact to pinprick on all 4 extremities. The patient indicates that vibration sensation is symmetric on the arms, he will not respond with testing on the legs.  Coordination: Cerebellar testing could not be performed, the patient will not follow commands for cerebellar testing.  Gait and station: Gait could not be tested, the patient is wheelchair-bound.  Reflexes: Deep tendon reflexes are symmetric and normal bilaterally. Toes are downgoing bilaterally.   Assessment/Plan:  1. Altered mental status  2. Possible seizures  3. Cerebrovascular disease, right brain stroke  4. Gait disorder  5. Diabetes  The patient has had a sudden change in mental status, and he has not  returned to baseline since onset on 10/30/2013. The patient appears to be abulic, with minimal verbal output. The patient is not using the right arm as well as the left, and the fingers are in flexion on the right arm. The prior MRI the brain suggests an old right brain stroke. The patient does have chronic white matter changes in hemispheres bilaterally. The patient will be reevaluated, and a MRI of the brain will be repeated looking for evidence of a frontal stroke or a left brain stroke that was possibly undetected on prior examination. An EEG study will be repeated, and blood work will be done. The patient followup in 3-4 months. The Keppra will be continued for now.  Marlan Palau MD 12/19/2013 11:46 AM  [facility] [address] [place], [place] [address]  Phone [phone] Fax [phone]

## 2013-12-19 NOTE — Telephone Encounter (Signed)
I spoke to Delfino LovettBobby Harris, RN the patient's nurse from [facility] to give clarification of orders from today's visit.  Also faxed the patient's lab orders which they will have done at the facility, since our lab is closed today.  Also explained they would hear once MRI is scheduled.

## 2013-12-20 ENCOUNTER — Telehealth: Payer: Self-pay | Admitting: Neurology

## 2013-12-20 NOTE — Telephone Encounter (Signed)
Conversation noted, the patient will have his sister present on the March appointment.

## 2013-12-20 NOTE — Telephone Encounter (Signed)
[name] w/[facility] called and stated that she was able to get in touch with Mr. [name]'s family.  [name] will be attending the appointment on 12-27-13.  Please let Dr. Anne HahnWillis know as he had requested this.  Thank you.

## 2013-12-22 ENCOUNTER — Non-Acute Institutional Stay (SKILLED_NURSING_FACILITY): Payer: Medicare Other | Admitting: Internal Medicine

## 2013-12-22 DIAGNOSIS — I639 Cerebral infarction, unspecified: Secondary | ICD-10-CM

## 2013-12-22 DIAGNOSIS — R569 Unspecified convulsions: Secondary | ICD-10-CM

## 2013-12-22 DIAGNOSIS — I6350 Cerebral infarction due to unspecified occlusion or stenosis of unspecified cerebral artery: Secondary | ICD-10-CM

## 2013-12-24 NOTE — Progress Notes (Signed)
Patient ID: Jermaine CoyerWilliam Little, male   DOB: 09/25/1946, 68 y.o.   MRN: [id number] Facility; [name] Chief complaint; review of neurologic status History; this is a patient who is been in this facility for a prolonged period of time. Believe he transferred to her service at some point last year although I only ever saw him for the first time in January of 2015. 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  In early January he was sent to hospital with altered level of consciousness on the right facial droop. His MRI/MRA did not suggest an acute stroke. He was felt to possibly have a seizure although his EEG did not suggest an epileptic focus.. he was prescribed Keppra which she is still taking. His MRI did show her mole to right parietal CVA and bilateral cerebellar infarcts.  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  Physical examination; Gen. patient greeted me in a friendly fashion Respiratory clear entry bilaterally Cardiac heart sounds are normal there is no murmur Neurologic; cranial nerves; his visual fields seem intact, extraocular movements are normal, cranial nerves 5, 7, 9, 10 all seem intact.  Motor; he has definite weakness on the right side. Tense the posterior his fingers on the right hand. He also has weakness of the right leg. His left side is stronger.   Speech; his spontaneous output is sparse. Hilda LiasMarie comes up with appropriate responses to my questions. Falls my commands. His speech he comes up with is certainly clear there is no evidence of a cortical language problem.  Impression/plan #1 altered mental status felt to be a seizure still on Keppra. Recommendations from neurology  were to continue this. He is to have a followup MRI. A battery of the test was ordered by neurology. This would include vitamin B1 level, HIV serology, B12, RPR vitamin B12 and sedimentation rates. These all appear to be within the normal range. I think some of the findings on exam a fit with a cerebellar strokes that have been previously described. 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 Have not added any further tests or investigations. Followup MRI has been ordered by neurology. I really have not seen this patient frequently enough to be of a firm opinion whether anything is recently changed      CLINICAL DATA:  Acute onset of unresponsiveness.   EXAM: MRI HEAD WITHOUT CONTRAST   MRA HEAD WITHOUT CONTRAST   TECHNIQUE: Multiplanar, multiecho pulse sequences of the brain and surrounding structures were obtained without intravenous contrast. Angiographic images of the head were obtained using MRA technique without contrast.   COMPARISON:  Head CT earlier the same day   FINDINGS: MRI HEAD FINDINGS   There is no evidence of acute infarct. Right frontoparietal encephalomalacia is consistent with remote MCA infarct. Encephalomalacia/atrophy is noted involving the left cerebral peduncle. Small, old cerebellar infarcts are noted, right greater than left. Periventricular white matter T2 hyperintensities are compatible with moderate chronic small vessel ischemic disease. There is moderate cerebral atrophy, mildly advanced for age. Remote microhemorrhages are noted in the right greater than left cerebellar hemispheres as well as left basal ganglia. There is no evidence of mass, midline  shift, or extra-axial fluid collection. Orbits are unremarkable. Major intracranial vascular flow voids are unremarkable. Visualized paranasal sinuses and mastoid air cells are clear. Calvarium is normal in signal.   MRA HEAD FINDINGS   Images are mildly degraded by motion artifact.  Visualized distal left vertebral artery is patent and dominant, demonstrating mild diffuse irregularity. The distal right vertebral artery is not well identified. Basilar artery is patent with mild irregularity but no evidence of high-grade stenosis. Left PICA appears patent, although its origin is not well evaluated. SCAs are patent bilaterally. Right PCA origin is patent. Left P1 segment appears either occluded or congenitally hypoplastic/aplastic. There is a small left posterior communicating artery. Minimal, intermittent flow is identified in left PCA branches more distally. There is mild to moderate irregularity of the right PCA.   Internal carotid arteries are patent from skullbase to carotid terminus. The left intracranial internal carotid artery appears mildly dominant compared to the right. There is mild, diffuse narrowing and irregularity of the left carotid siphon and supraclinoid left ICA. The right A1 segment is markedly hypoplastic. Right A2 segment is predominantly supplied via the anterior communicating artery. The left ACA is unremarkable. MCA origins are patent bilaterally. Visualized left MCA branches are patent with mild irregularity. Right MCA trifurcation is patent, however there is occlusion of small distal branches likely related to prior infarct. There also a few areas of apparent moderate focal stenosis involving proximal M2 branches on the right. No intracranial aneurysm is identified.   IMPRESSION: 1. No evidence of acute infarct or other acute intracranial abnormality. 2. Remote right MCA and bilateral cerebellar infarcts. 3. Mild to moderate irregularity and narrowing of right greater than left MCA branches consistent with atherosclerosis. 4. Occlusion of distal right MCA branches, likely related to prior infarct. 5. Mild to moderate posterior circulation irregularity, consistent with atherosclerosis. Left P1 segment appears either occluded  or congenitally aplastic, with minimal flow in more distal left PCA branches. 6. 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     Electronically Signed   By: Sebastian Ache   On: 10/30/2013 12:30

## 2013-12-27 ENCOUNTER — Telehealth: Payer: Self-pay | Admitting: *Deleted

## 2013-12-27 ENCOUNTER — Other Ambulatory Visit: Payer: Medicare Other

## 2013-12-27 ENCOUNTER — Ambulatory Visit (INDEPENDENT_AMBULATORY_CARE_PROVIDER_SITE_OTHER): Payer: Medicare Other | Admitting: Radiology

## 2013-12-27 DIAGNOSIS — I639 Cerebral infarction, unspecified: Secondary | ICD-10-CM

## 2013-12-27 DIAGNOSIS — R4182 Altered mental status, unspecified: Secondary | ICD-10-CM

## 2013-12-27 NOTE — Procedures (Signed)
      Thurman CoyerWilliam Little is a 68 year old right-handed white male with a history of cerebrovascular disease with a prior right brain stroke. The patient began having a sudden change in functional status on 10/30/2013. The patient was found unresponsive and drooling. Workup did not show evidence of a new stroke, but the patient's mental status never returned to baseline. The patient is being reevaluated for the mental status changes.  This is a routine EEG. No skull defects are noted. Medications include Plavix, Colace, Prozac, gabapentin, Keppra, lisinopril, metformin, multivitamins, Pravachol, and Seroquel.  EEG classification: Dysrhythmia grade 1 generalized  Description of the recording: The background rhythms of this recording consists of a fairly well modulated medium amplitude 7 Hz theta activity that is reactive to eye opening and closure. As the record progresses, photic stimulation is performed, and results in a minimal but bilateral photic response. Hyperventilation was not performed. The background slowing was seen throughout the recording, without obvious asymmetry from one hemisphere to the next. At no time during the recording does there appear to be evidence of spike or spike wave discharges or evidence of focal slowing. At times, excessive movement artifacts obscure the background rhythm activities. EKG monitor shows no evidence of cardiac rhythm abnormalities with a heart rate of 56.  Impression: This is an abnormal EEG recording secondary to diffuse mild background slowing. This is a nonspecific recording, and can be seen with any mild metabolic or toxic encephalopathy, or any dementing illness. No clear evidence of epileptiform discharges are seen.

## 2013-12-27 NOTE — Telephone Encounter (Signed)
I called the family. The EEG study done today was normal. The patient clearly is less responsive than normal, usually will carry on a conversation. MRI of the brain is pending.

## 2013-12-27 NOTE — Telephone Encounter (Addendum)
Sister came in today , thought that she was suppose to speak with Dr Anne HahnWillis during a OV,patient did not have an appt. today, only a EEG(see phone note from 12/20/13 - patient did not have an appt. on 12/27/13 with physician). Sister was offered sooner appointment with Dr Anne HahnWillis but she can't come on that date,  will not be attending appt. with NP on 04/18/14 with the patient. She was told that Dr Anne HahnWillis wanted to speak with family concerning patient, may contact her if needed at [phone].

## 2014-01-03 ENCOUNTER — Telehealth: Payer: Self-pay | Admitting: Neurology

## 2014-01-03 ENCOUNTER — Ambulatory Visit
Admission: RE | Admit: 2014-01-03 | Discharge: 2014-01-03 | Disposition: A | Discharge status: Home / Self Care | Payer: Medicare Other | Source: Ambulatory Visit | Attending: Neurology | Admitting: Neurology

## 2014-01-03 DIAGNOSIS — R4182 Altered mental status, unspecified: Secondary | ICD-10-CM

## 2014-01-03 DIAGNOSIS — I639 Cerebral infarction, unspecified: Secondary | ICD-10-CM

## 2014-01-03 NOTE — Telephone Encounter (Signed)
I called the sister, discussed the MRI results. This does not show any change from January 2015. The patient has a left midbrain and brainstem stroke, right cortical stroke. The patient has weakness mainly on the right side the body. The patient apparently was more alert when he went the [place], became less responsive back at the extended care facility. I would question whether depression may be an etiology. We may try to get him down on the Keppra to a lower dose in the future.   MRI brain 01/03/2014:  Impression   Abnormal MRI brain (without) demonstrating: 1. Chronic ischemic infarctions in the right frontal, right parietal,  right temporal, right inferior cerebellar and left cerebral peduncle  chronic ischemic infarctions with encephalomalacia and gliosis. 2. Moderate periventricular and subcortical chronic small vessel ischemic  disease.  3. No acute findings. 4. No significant change from MRI on 10/30/13.

## 2014-02-18 ENCOUNTER — Non-Acute Institutional Stay (SKILLED_NURSING_FACILITY): Payer: Medicare Other | Admitting: Internal Medicine

## 2014-02-18 DIAGNOSIS — H01009 Unspecified blepharitis unspecified eye, unspecified eyelid: Secondary | ICD-10-CM

## 2014-02-18 DIAGNOSIS — H01002 Unspecified blepharitis right lower eyelid: Secondary | ICD-10-CM

## 2014-02-21 NOTE — Progress Notes (Addendum)
Patient ID: Jermaine CoyerWilliam Little, male   DOB: 10/27/1946, 68 y.o.   MRN: [id number]                  PROGRESS NOTE  DATE:  02/18/2014    [facility]    LEVEL OF CARE:   SNF   Acute Visit   CHIEF COMPLAINT:  Right eye inflammation.    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    It has been noted by the staff that he has swelling of the right eye, irritation.  He has been noted to be rubbing his eye for a day or two.  It was felt by the nurse who informed me of this that there may be some contamination directly placed to the eye.    PHYSICAL EXAMINATION:   HEENT:   EYES:  Right eye:  There is a significant amount of crusting on his eyelid, irritation, and lower eyelid swelling.  There does not appear to be an intraocular emergency.  His vision appears to be intact.    ASSESSMENT/PLAN:  Blepharitis.  I am going to treat him with topical antibiotics and warm compreses.  We will have another look at this later in the week if it is not resolving.

## 2014-04-18 ENCOUNTER — Ambulatory Visit: Payer: Self-pay | Admitting: Adult Health

## 2014-04-20 ENCOUNTER — Ambulatory Visit: Payer: Medicare Other | Admitting: Adult Health

## 2014-05-12 ENCOUNTER — Encounter (INDEPENDENT_AMBULATORY_CARE_PROVIDER_SITE_OTHER): Payer: Self-pay

## 2014-05-12 ENCOUNTER — Encounter: Payer: Self-pay | Admitting: Adult Health

## 2014-05-12 ENCOUNTER — Ambulatory Visit (INDEPENDENT_AMBULATORY_CARE_PROVIDER_SITE_OTHER): Payer: Medicare Other | Admitting: Adult Health

## 2014-05-12 VITALS — BP 123/69 | HR 58 | Temp 97.7°F | Ht 68.0 in

## 2014-05-12 DIAGNOSIS — I6350 Cerebral infarction due to unspecified occlusion or stenosis of unspecified cerebral artery: Secondary | ICD-10-CM

## 2014-05-12 DIAGNOSIS — I69998 Other sequelae following unspecified cerebrovascular disease: Secondary | ICD-10-CM

## 2014-05-12 DIAGNOSIS — G8110 Spastic hemiplegia affecting unspecified side: Secondary | ICD-10-CM

## 2014-05-12 DIAGNOSIS — Z8673 Personal history of transient ischemic attack (TIA), and cerebral infarction without residual deficits: Secondary | ICD-10-CM

## 2014-05-12 DIAGNOSIS — IMO0002 Reserved for concepts with insufficient information to code with codable children: Secondary | ICD-10-CM

## 2014-05-12 DIAGNOSIS — G8111 Spastic hemiplegia affecting right dominant side: Secondary | ICD-10-CM

## 2014-05-12 NOTE — Progress Notes (Addendum)
PATIENT: [name] DOB: 01/04/1946  REASON FOR VISIT: follow up HISTORY FROM: patient & caregiver  HISTORY OF PRESENT ILLNESS: [name] is a 68 year old male with a history of CVA and altered mental status. The patient returns today for follow-up. The patient's communication has improved drastically since the last visit. Caregiver reports that he asks a lot of questions and often repeats himself. He resides at [facility]. Patient is able to feed himself. Patient reports that he tries to help with transfers. Caregiver is unsure how is transfers are. Patient states that he is able to dress himself and bath himself with little assistance. Caregiver states that he interacts more with the staff and the other residents. He stays out in the hallway or sometimes in the cafeteria. Patient continues on keppra and is tolerating it well.  Patient was started on Prozac since the last visit. No new medical issues since last visit. Patient scored 11 on the geriatric depression scale.    REVIEW OF SYSTEMS: Full 14 system review of systems performed and notable only for:  Constitutional: N/A  Eyes: N/A Ear/Nose/Throat: N/A  Skin: N/A  Cardiovascular: N/A  Respiratory: N/A  Gastrointestinal: N/A  Genitourinary: N/A Hematology/Lymphatic: N/A  Endocrine: N/A Musculoskeletal:N/A  Allergy/Immunology: N/A  Neurological: N/A Psychiatric: N/A Sleep: N/A   ALLERGIES: No Known Allergies  HOME MEDICATIONS: Outpatient Prescriptions Prior to Visit  Medication Sig Dispense Refill  . clopidogrel (PLAVIX) 75 MG tablet Take 75 mg by mouth daily with breakfast.      . docusate (COLACE) 50 MG/5ML liquid Take 100 mg by mouth 2 (two) times daily.      . feeding supplement, GLUCERNA SHAKE, (GLUCERNA SHAKE) LIQD Take 237 mLs by mouth daily.    0  . FLUoxetine (PROZAC) 10 MG capsule Take 10 mg by mouth daily.       . folic acid (FOLVITE) 1 MG tablet Take 1 mg by mouth daily.      Marland Kitchen gabapentin  (NEURONTIN) 100 MG capsule Take 200 mg by mouth every morning.      . gabapentin (NEURONTIN) 100 MG capsule Take 2 capsules (200 mg total) by mouth at bedtime.      . levETIRAcetam (KEPPRA) 500 MG tablet Take 1 tablet (500 mg total) by mouth 2 (two) times daily.      Marland Kitchen lisinopril (PRINIVIL,ZESTRIL) 20 MG tablet Take 20 mg by mouth daily.      . metFORMIN (GLUCOPHAGE) 1000 MG tablet Take 1,000 mg by mouth 2 (two) times daily with a meal.      . Multiple Vitamins-Minerals (CERTAGEN PO) Take 1 tablet by mouth daily.      . polyethylene glycol (MIRALAX / GLYCOLAX) packet Take 17 g by mouth daily.      . pravastatin (PRAVACHOL) 20 MG tablet Take 20 mg by mouth daily.      . QUEtiapine (SEROQUEL) 25 MG tablet Take 0.5 tablets (12.5 mg total) by mouth at bedtime.      . saccharomyces boulardii (FLORASTOR) 250 MG capsule Take 250 mg by mouth 2 (two) times daily.       No facility-administered medications prior to visit.    PAST MEDICAL HISTORY: Past Medical History  Diagnosis Date  . Hyperlipemia   . Diabetes   . Hemiplegia   . Vertebral artery occlusion   . Substance abuse   . Hypertension   . Cerebral artery occlusion with cerebral infarction 08/22/2011    PAST SURGICAL HISTORY: No past surgical history  on file.  FAMILY HISTORY: No family history on file.  SOCIAL HISTORY: History   Social History  . Marital Status: Unknown    Spouse Name: N/A    Number of Children: 0  . Years of Education: hs   Occupational History  . disablity    Social History Main Topics  . Smoking status: Former Smoker -- 50 years  . Smokeless tobacco: Never Used  . Alcohol Use: No  . Drug Use: Yes     Comment: Cocaine   . Sexual Activity: Not on file   Other Topics Concern  . Not on file   Social History Narrative  . No narrative on file      PHYSICAL EXAM  Filed Vitals:   05/12/14 1105  BP: 123/69  Pulse: 58  Temp: 97.7 F (36.5 C)  TempSrc: Oral  Height: 5\' 8"  (1.727 m)   Body  mass index is 0.00 kg/(m^2).  Generalized: Well developed, in no acute distress   Neurological examination  Mentation: Alert oriented to self and place but not time. Speech and language fluent. MMSE- unable to score d/t patient not being able to draw figure or write sentence.  Cranial nerve II-XII: Pupils were equal round reactive to light. Extraocular movements were full, visual field were full on confrontational test.  Head turning and shoulder shrug  were normal and symmetric. Motor: The motor testing reveals 4 over 5 strength in the left upper and lower extremity. 2 over 5 strength is the right lower extremity. Right upper extremity is contracted but is not fixed, can be straightened. Minimal movement with the fingers. Good symmetric motor tone is noted throughout.  Sensory: Sensory testing is intact to soft touch on all 4 extremities. No evidence of extinction is noted.  Coordination: Cerebellar testing reveals good finger-nose-finger on the left, some difficulty on the right due to limited mobility in that arm. Unable to complete heel-to-shin bilaterally.  Gait and station: wheelchair bound.  Reflexes: Deep tendon reflexes are symmetric and normal bilaterally.    DIAGNOSTIC DATA (LABS, IMAGING, TESTING) - I reviewed patient records, labs, notes, testing and imaging myself where available.  Lab Results  Component Value Date   WBC 12.2* 11/02/2013   HGB 11.5* 11/02/2013   HCT 34.4* 11/02/2013   MCV 91.5 11/02/2013   PLT 243 11/02/2013      Component Value Date/Time   NA 142 11/02/2013 0425   K 3.2* 11/02/2013 0425   CL 107 11/02/2013 0425   CO2 20 11/02/2013 0425   GLUCOSE 145* 11/02/2013 0425   BUN 8 11/02/2013 0425   CREATININE 0.73 11/02/2013 0425   CALCIUM 8.3* 11/02/2013 0425   PROT 7.2 10/30/2013 1041   ALBUMIN 3.9 10/30/2013 1041   AST 12 10/30/2013 1041   ALT 13 10/30/2013 1041   ALKPHOS 52 10/30/2013 1041   BILITOT 0.3 10/30/2013 1041   GFRNONAA >90 11/02/2013 0425   GFRAA >90 11/02/2013 0425    Lab  Results  Component Value Date   HGBA1C 6.3* 10/30/2013    Lab Results  Component Value Date   TSH 1.641 10/30/2013      ASSESSMENT AND PLAN 68 y.o. year old male  has a past medical history of Hyperlipemia; Diabetes; Hemiplegia; Vertebral artery occlusion; Substance abuse; Hypertension; and Cerebral artery occlusion with cerebral infarction (08/22/2011). here with:  1. History of CVA 2. Residual weakness from CVA 3. Right spastic hemiparesis  Patient's mental status has improved from the last visit. Patient is verbal and able  to communicate. Some memory loss is present. Patient has residual weakness in extremities from prior stroke more on the right than left. Patient has right spastic hemiparesis and I feel he would benefit from Botox injections. I consulted with Dr. Terrace ArabiaYan regarding this patient and she feels that he is a good candidate as well. I have also requested that the patient receive physical therapy from [facility]. Patient should follow-up in 6 months or sooner if needed.  Butch PennyMegan Naylene Foell, MSN, NP-C 05/12/2014, 11:26 AM [facility] [address], [address] [place], [place] [address] [phone]  Note: This document was prepared with digital dictation and possible smart phrase technology. Any transcriptional errors that result from this process are unintentional.

## 2014-05-12 NOTE — Patient Instructions (Signed)
Stroke Prevention Some medical conditions and behaviors are associated with an increased chance of having a stroke. You may prevent a stroke by making healthy choices and managing medical conditions. HOW CAN I REDUCE MY RISK OF HAVING A STROKE?   Stay physically active. Get at least 30 minutes of activity on most or all days.  Do not smoke. It may also be helpful to avoid exposure to secondhand smoke.  Limit alcohol use. Moderate alcohol use is considered to be:  No more than 2 drinks per day for men.  No more than 1 drink per day for nonpregnant women.  Eat healthy foods. This involves  Eating 5 or more servings of fruits and vegetables a day.  Following a diet that addresses high blood pressure (hypertension), high cholesterol, diabetes, or obesity.  Manage your cholesterol levels.  A diet low in saturated fat, trans fat, and cholesterol and high in fiber may control cholesterol levels.  Take any prescribed medicines to control cholesterol as directed by your health care provider.  Manage your diabetes.  A controlled-carbohydrate, controlled-sugar diet is recommended to manage diabetes.  Take any prescribed medicines to control diabetes as directed by your health care provider.  Control your hypertension.  A low-salt (sodium), low-saturated fat, low-trans fat, and low-cholesterol diet is recommended to manage hypertension.  Take any prescribed medicines to control hypertension as directed by your health care provider.  Maintain a healthy weight.  A reduced-calorie, low-sodium, low-saturated fat, low-trans fat, low-cholesterol diet is recommended to manage weight.  Stop drug abuse.  Avoid taking birth control pills.  Talk to your health care provider about the risks of taking birth control pills if you are over 35 years old, smoke, get migraines, or have ever had a blood clot.  Get evaluated for sleep disorders (sleep apnea).  Talk to your health care provider about  getting a sleep evaluation if you snore a lot or have excessive sleepiness.  Take medicines as directed by your health care provider.  For some people, aspirin or blood thinners (anticoagulants) are helpful in reducing the risk of forming abnormal blood clots that can lead to stroke. If you have the irregular heart rhythm of atrial fibrillation, you should be on a blood thinner unless there is a good reason you cannot take them.  Understand all your medicine instructions.  Make sure that other other conditions (such as anemia or atherosclerosis) are addressed. SEEK IMMEDIATE MEDICAL CARE IF:   You have sudden weakness or numbness of the face, arm, or leg, especially on one side of the body.  Your face or eyelid droops to one side.  You have sudden confusion.  You have trouble speaking (aphasia) or understanding.  You have sudden trouble seeing in one or both eyes.  You have sudden trouble walking.  You have dizziness.  You have a loss of balance or coordination.  You have a sudden, severe headache with no known cause.  You have new chest pain or an irregular heartbeat. Any of these symptoms may represent a serious problem that is an emergency. Do not wait to see if the symptoms will go away. Get medical help at once. Call your local emergency services  (911 in [place] Do not drive yourself to the hospital. Document Released: 11/20/2004 Document Revised: 08/03/2013 Document Reviewed: 04/15/2013 [employer] Patient Information 2015 [employer]. This information is not intended to replace advice given to you by your health care provider. Make sure you discuss any questions you have with your health   care provider.  

## 2014-05-14 NOTE — Progress Notes (Signed)
I have read the note, and I agree with the clinical assessment and plan.  [name],[name]   

## 2014-06-14 ENCOUNTER — Non-Acute Institutional Stay (SKILLED_NURSING_FACILITY): Payer: Medicare Other | Admitting: Internal Medicine

## 2014-06-14 DIAGNOSIS — E1159 Type 2 diabetes mellitus with other circulatory complications: Secondary | ICD-10-CM

## 2014-06-14 DIAGNOSIS — F0150 Vascular dementia without behavioral disturbance: Secondary | ICD-10-CM

## 2014-06-14 DIAGNOSIS — R569 Unspecified convulsions: Secondary | ICD-10-CM

## 2014-06-19 NOTE — Progress Notes (Addendum)
Patient ID: [name], male   DOB: 01-20-1946, 68 y.o.   MRN: [id number]              PROGRESS NOTE  DATE:  06/14/2014    FACILITY: [facility]    LEVEL OF CARE:   SNF   Routine Visit   CHIEF COMPLAINT:  Routine visit/review of neurologic status.    HISTORY OF PRESENT ILLNESS:  This is a patient who came to the facility sometime in 2014, although I never saw him until January 2015.  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    In January of this year, he was sent to hospital with altered level of consciousness and a right facial droop.  His MRI/MRA did not suggest an acute stroke.  He was felt to have a seizure and he was started on Keppra.  He follows with [facility].  By review of his last neurologic appointment, the suggestion came up about Botox injections for his spastic right arm.  I am not completely certain where this discussion has led.    There was also some discussion about repeating an MRI.  In looking through [facility], however, the last MRI he had was on March 10th.  This showed chronic ischemic infarctions in the right frontal, right parietal, right temporal, and right inferior cerebellar and left cerebral peduncle with encephalomalacia and gliosis.  There was moderate periventricular small vessel disease.  This was unchanged from an MRI of 10/30/2013.    CURRENT MEDICATIONS:  Medication list is reviewed.     Plavix 75 q.d.    Lisinopril 20 q.d.    MiraLAX 17 g q.d.    Prozac 10 mg daily.    Colace 100 b.i.d.    Neurontin 200 in the morning and at bedtime.    Keppra 500 twice a day.    Pravachol 20 q.h.s.    REVIEW OF SYSTEMS:   GENERAL:  The patient does not complain of anything at all.  States that he likes to lie in bed.   CHEST/RESPIRATORY:  No shortness of breath.    CARDIAC:   No clear chest pain.   GI:  No abdominal pain.    PHYSICAL EXAMINATION:   GENERAL APPEARANCE:  The  patient is not in any distress.   CHEST/RESPIRATORY:  Clear air entry bilaterally.   CARDIOVASCULAR:  CARDIAC:   Heart sounds are normal.  There are no murmurs and no carotid bruits.   GASTROINTESTINAL:  LIVER/SPLEEN/KIDNEYS:  No liver, no spleen.  No tenderness.   GENITOURINARY:  BLADDER:   Not enlarged.  I suspect he has underlying urinary incontinence as he has incontinence garments.   NEUROLOGICAL:   He holds his right upper extremity in a spastic position.  He has better control of the right leg.  I suspect he has some degree of neglect.    LABORATORY DATA:  None recently done, although he had an extensive work-up earlier this year by Neurology.    ASSESSMENT/PLAN:  Multi-infarct dementia.  He thinks he is 68 years old, although he could give me his date of birth.  He knows he is in [place], but cannot name the facility.  There is no evidence of depression currently.    Possible seizure disorder during his last hospitalization in January 2015.  He is on Neurontin and Keppra.  As far as I am aware, there has been no suggestion of seizure disorder.  Hyperlipidemia.  On Pravachol 20 mg a day.  He will need a fasting lipid panel.    Type 2 diabetes.  On Glucophage at 1000 mg twice a day.  Last hemoglobin A1c I see was 6.3.  I will repeat this.    Overall, the patient appears to be reasonably stable.  I am not completely certain about any utility of Botox, although I will not stand in the way if the patient or his representative elect to proceed with this.

## 2014-06-28 ENCOUNTER — Non-Acute Institutional Stay (SKILLED_NURSING_FACILITY): Payer: Medicare Other | Admitting: Internal Medicine

## 2014-06-28 DIAGNOSIS — K922 Gastrointestinal hemorrhage, unspecified: Secondary | ICD-10-CM

## 2014-06-30 ENCOUNTER — Encounter (HOSPITAL_COMMUNITY): Payer: Self-pay | Admitting: Emergency Medicine

## 2014-06-30 ENCOUNTER — Encounter: Payer: Self-pay | Admitting: Internal Medicine

## 2014-06-30 ENCOUNTER — Emergency Department (HOSPITAL_COMMUNITY)
Admission: EM | Admit: 2014-06-30 | Discharge: 2014-07-27 | Disposition: E | Payer: Medicare Other | Attending: Emergency Medicine | Admitting: Emergency Medicine

## 2014-06-30 ENCOUNTER — Emergency Department (HOSPITAL_COMMUNITY): Payer: Medicare Other

## 2014-06-30 DIAGNOSIS — Z792 Long term (current) use of antibiotics: Secondary | ICD-10-CM | POA: Diagnosis not present

## 2014-06-30 DIAGNOSIS — Z79899 Other long term (current) drug therapy: Secondary | ICD-10-CM | POA: Diagnosis not present

## 2014-06-30 DIAGNOSIS — Z8669 Personal history of other diseases of the nervous system and sense organs: Secondary | ICD-10-CM | POA: Insufficient documentation

## 2014-06-30 DIAGNOSIS — R4182 Altered mental status, unspecified: Secondary | ICD-10-CM | POA: Insufficient documentation

## 2014-06-30 DIAGNOSIS — Z8673 Personal history of transient ischemic attack (TIA), and cerebral infarction without residual deficits: Secondary | ICD-10-CM | POA: Diagnosis not present

## 2014-06-30 DIAGNOSIS — Z7902 Long term (current) use of antithrombotics/antiplatelets: Secondary | ICD-10-CM | POA: Diagnosis not present

## 2014-06-30 DIAGNOSIS — I10 Essential (primary) hypertension: Secondary | ICD-10-CM | POA: Insufficient documentation

## 2014-06-30 DIAGNOSIS — Z87891 Personal history of nicotine dependence: Secondary | ICD-10-CM | POA: Insufficient documentation

## 2014-06-30 DIAGNOSIS — I469 Cardiac arrest, cause unspecified: Secondary | ICD-10-CM | POA: Insufficient documentation

## 2014-06-30 DIAGNOSIS — E119 Type 2 diabetes mellitus without complications: Secondary | ICD-10-CM | POA: Insufficient documentation

## 2014-06-30 LAB — URINALYSIS, ROUTINE W REFLEX MICROSCOPIC
Bilirubin Urine: NEGATIVE
GLUCOSE, UA: NEGATIVE mg/dL
KETONES UR: 15 mg/dL — AB
LEUKOCYTES UA: NEGATIVE
Nitrite: NEGATIVE
PROTEIN: 100 mg/dL — AB
Specific Gravity, Urine: 1.02 (ref 1.005–1.030)
Urobilinogen, UA: 0.2 mg/dL (ref 0.0–1.0)
pH: 5.5 (ref 5.0–8.0)

## 2014-06-30 LAB — COMPREHENSIVE METABOLIC PANEL WITH GFR
ALT: 188 U/L — ABNORMAL HIGH (ref 0–53)
AST: 217 U/L — ABNORMAL HIGH (ref 0–37)
Albumin: 2.6 g/dL — ABNORMAL LOW (ref 3.5–5.2)
Alkaline Phosphatase: 77 U/L (ref 39–117)
BUN: 84 mg/dL — ABNORMAL HIGH (ref 6–23)
CO2: <7 meq/L — CL (ref 19–32)
Calcium: 9.6 mg/dL (ref 8.4–10.5)
Chloride: 96 meq/L (ref 96–112)
Creatinine, Ser: 9.04 mg/dL — ABNORMAL HIGH (ref 0.50–1.35)
GFR calc Af Amer: 6 mL/min — ABNORMAL LOW
GFR calc non Af Amer: 5 mL/min — ABNORMAL LOW
Glucose, Bld: 341 mg/dL — ABNORMAL HIGH (ref 70–99)
Potassium: 6 meq/L — ABNORMAL HIGH (ref 3.7–5.3)
Sodium: 142 meq/L (ref 137–147)
Total Bilirubin: 0.2 mg/dL — ABNORMAL LOW (ref 0.3–1.2)
Total Protein: 5.5 g/dL — ABNORMAL LOW (ref 6.0–8.3)

## 2014-06-30 LAB — PRO B NATRIURETIC PEPTIDE: Pro B Natriuretic peptide (BNP): 1176 pg/mL — ABNORMAL HIGH (ref 0–125)

## 2014-06-30 LAB — CBC WITH DIFFERENTIAL/PLATELET
Basophils Absolute: 0.1 K/uL (ref 0.0–0.1)
Basophils Relative: 1 % (ref 0–1)
Eosinophils Absolute: 0.1 K/uL (ref 0.0–0.7)
Eosinophils Relative: 0 % (ref 0–5)
HCT: 42.1 % (ref 39.0–52.0)
Hemoglobin: 12.7 g/dL — ABNORMAL LOW (ref 13.0–17.0)
Lymphocytes Relative: 23 % (ref 12–46)
Lymphs Abs: 6.4 K/uL — ABNORMAL HIGH (ref 0.7–4.0)
MCH: 30.9 pg (ref 26.0–34.0)
MCHC: 30.2 g/dL (ref 30.0–36.0)
MCV: 102.4 fL — ABNORMAL HIGH (ref 78.0–100.0)
Monocytes Absolute: 2.5 K/uL — ABNORMAL HIGH (ref 0.1–1.0)
Monocytes Relative: 9 % (ref 3–12)
Neutro Abs: 19.1 K/uL — ABNORMAL HIGH (ref 1.7–7.7)
Neutrophils Relative %: 68 % (ref 43–77)
Platelets: 362 K/uL (ref 150–400)
RBC: 4.11 MIL/uL — ABNORMAL LOW (ref 4.22–5.81)
RDW: 14.1 % (ref 11.5–15.5)
WBC: 28.1 K/uL — ABNORMAL HIGH (ref 4.0–10.5)

## 2014-06-30 LAB — BLOOD GAS, ARTERIAL
Drawn by: 22179
FIO2: 100 %
FIO2: 50 %
LHR: 14 {breaths}/min
MECHVT: 500 mL
O2 Saturation: 97.8 %
O2 Saturation: 94.1 %
PATIENT TEMPERATURE: 37
PEEP: 5 cmH2O
PEEP: 5 cmH2O
Patient temperature: 37
RATE: 16 {breaths}/min
VT: 500 mL
pCO2 arterial: 21.6 mmHg — ABNORMAL LOW (ref 35.0–45.0)
pCO2 arterial: 20.9 mmHg — ABNORMAL LOW (ref 35.0–45.0)
pO2, Arterial: 574 mmHg — ABNORMAL HIGH (ref 80.0–100.0)
pO2, Arterial: 132 mmHg — ABNORMAL HIGH (ref 80.0–100.0)

## 2014-06-30 LAB — URINE MICROSCOPIC-ADD ON

## 2014-06-30 LAB — CBG MONITORING, ED
Glucose-Capillary: 225 mg/dL — ABNORMAL HIGH (ref 70–99)
Glucose-Capillary: 77 mg/dL (ref 70–99)

## 2014-06-30 LAB — PROCALCITONIN: Procalcitonin: 12.8 ng/mL

## 2014-06-30 LAB — TROPONIN I: Troponin I: 0.52 ng/mL

## 2014-06-30 LAB — LACTIC ACID, PLASMA: Lactic Acid, Venous: 17.5 mmol/L — ABNORMAL HIGH (ref 0.5–2.2)

## 2014-06-30 MED ORDER — SODIUM BICARBONATE 8.4 % IV SOLN
150.0000 meq | Freq: Once | INTRAVENOUS | Status: AC
  Administered 2014-06-30: 150 meq via INTRAVENOUS
  Filled 2014-06-30: qty 150

## 2014-06-30 MED ORDER — DEXTROSE 5 % IV SOLN
1.0000 g | Freq: Once | INTRAVENOUS | Status: AC
  Administered 2014-06-30: 1 g via INTRAVENOUS
  Filled 2014-06-30: qty 10

## 2014-06-30 MED ORDER — DEXTROSE 50 % IV SOLN
INTRAVENOUS | Status: AC
  Filled 2014-06-30: qty 50

## 2014-06-30 MED ORDER — SODIUM CHLORIDE 0.9 % IV BOLUS (SEPSIS)
1000.0000 mL | Freq: Once | INTRAVENOUS | Status: AC
  Administered 2014-06-30: 1000 mL via INTRAVENOUS

## 2014-06-30 MED ORDER — DEXTROSE 50 % IV SOLN
50.0000 mL | Freq: Once | INTRAVENOUS | Status: AC
  Administered 2014-06-30: 50 mL via INTRAVENOUS

## 2014-06-30 MED ORDER — DOPAMINE-DEXTROSE 3.2-5 MG/ML-% IV SOLN
5.0000 ug/kg/min | INTRAVENOUS | Status: DC
  Administered 2014-06-30: 5 ug/kg/min via INTRAVENOUS
  Filled 2014-06-30: qty 250

## 2014-07-02 LAB — URINE CULTURE
Colony Count: NO GROWTH
Culture: NO GROWTH

## 2014-07-05 LAB — CULTURE, BLOOD (ROUTINE X 2)
CULTURE: NO GROWTH
Culture: NO GROWTH

## 2014-07-06 NOTE — Progress Notes (Addendum)
Patient ID: [name], male   DOB: 15-Feb-1946, 68 y.o.   MRN: [id number]               PROGRESS NOTE  DATE:  06/28/2014    FACILITY: [facility]    LEVEL OF CARE:   SNF   Acute Visit   CHIEF COMPLAINT:  Rectal bleeding.    HISTORY OF PRESENT ILLNESS:  This is a man who came here in 2014.    He is a type 2 diabetic, although he is not on any treatment for this.      His predominant disability is a multiple stroke syndrome with a right cerebellar stroke in 2014.  This is severe.   He has hypertension.    He takes Plavix for DVT prophylaxis.    I was called over the weekend to a report that he had a large, solitary bright red blood per rectum.  We did a hemoglobin on him yesterday which was 14.2, unchanged from one done on 06/17/2014 at 13.9.  However, his white count was 9.5 on 06/17/2014.  Yesterday, it was 16.7.  He has not had any further rectal bleeding.  His vital signs have been stable.    PHYSICAL EXAMINATION:   GENERAL APPEARANCE:  The patient looks in no distress.   Cardiac; HS are normal, appears euvolemic GASTROINTESTINAL:  LIVER/SPLEEN/KIDNEYS:  No liver, no spleen are palpable.   ABDOMEN:  There are no masses.  He does have left lower quadrant tenderness to deep palpation.  There is no guarding or rebound.   RECTAL:  Exam revealed a normal prostate.  There are no rectal masses.  No hemorrhoids.  Stool is faintly guaiac positive.    ASSESSMENT/PLAN:  Lower GI bleed.  I think the etiology of this is likely to be diverticular.  He has left lower quadrant tenderness on deep palpation, which is mild.  White count is 16.7.  I am going to give him antibiotics for a week.  Of course, the next step here in a reasonably healthy man would be to proceed with a GI consult for colonoscopy.  I have discussed this with his sister, who is his RP.  We agree that we are not going to proceed with this given the advanced state of his dementia (probably severe).  I will repeat labs  next week.  I have also taken this opportunity to discuss code status.  He will be a No Code and she agrees with this also, due to the relatively poor quality of his quality of life.

## 2014-07-19 ENCOUNTER — Ambulatory Visit: Payer: Medicare Other | Admitting: Neurology

## 2014-07-27 NOTE — ED Notes (Signed)
PT transported to hospital [facility] at this time.

## 2014-07-27 NOTE — ED Notes (Signed)
Per ems- pt unresponsive,cbg 33 upon arrival to sick call at [facility]. Pt began having agonal respirations and lost pulses in route. Pt intubated 7.5 at 22cm at teeth. Pt received 1 amp D50, 4 epi, and im glucagon in route. cbg 77 upon ED arrival. Pt in paced rhythm 70's with palpable femoral pulses upon ED arrival.

## 2014-07-27 NOTE — ED Notes (Signed)
CRITICAL VALUE ALERT  Critical value received:  Venous abg ph not reportable, co2 20.9, po2 132, sat 94%  Date of notification:  Jul 09, 2014  Time of notification:  1015  Critical value read back:Yes.    Nurse who received alert:  Rminter, rn  MD notified (1st page):  Dr. Estell Harpin  Time of first page:  1015  MD notified (2nd page):  Time of second page:  Responding MD:  Dr. Estell Harpin  Time MD responded:  [phone]

## 2014-07-27 NOTE — ED Notes (Signed)
Critical care at [facility] paged by [facility] for EDP.

## 2014-07-27 NOTE — Progress Notes (Signed)
This encounter was created in error - please disregard.

## 2014-07-27 NOTE — ED Notes (Signed)
Sisters present at this time with pt. Dr. Estell Harpin talking with 2 sisters at this time in private waiting room about pt's declining condition.

## 2014-07-27 NOTE — ED Notes (Addendum)
Dr. Estell Harpin consulted critical care and recommendation comfort care and verbal order to stop ventilator and d/c all drips. Sisters present at bedside at this time.

## 2014-07-27 NOTE — ED Notes (Signed)
CRITICAL VALUE ALERT  Critical value received:  ABG, <PH, pco2 21.6, 96% o2 sat, hg 11.6, po2 574  Date of notification:  07/20/2014  Time of notification:  0910  Critical value read back:Yes.    Nurse who received alert:  Rminter, RN  MD notified (1st page):  Dr. Estell Harpin  Time of first page:  0910  MD notified (2nd page):  Time of second page:  Responding MD:  Dr. Estell Harpin  Time MD responded:  [phone]

## 2014-07-27 NOTE — ED Notes (Signed)
Time of Death with Dr. Estell Harpin at bedside at 1159.

## 2014-07-27 NOTE — ED Notes (Signed)
[facility] nurse Judeth Cornfield made aware of pt's death.

## 2014-07-27 NOTE — ED Notes (Signed)
Bear hugger warmer blanket applied to pt on medium setting.

## 2014-07-27 NOTE — ED Notes (Signed)
CRITICAL VALUE ALERT  Critical value received:  co2 <7 and troponin 0.52  Date of notification:  07/18/2014  Time of notification:  0942  Critical value read back:Yes.    Nurse who received alert:  rminter  MD notified (1st page):  Dr. Estell Harpin  Time of first page:  [phone]  MD notified (2nd page):  Time of second page:  Responding MD:  Dr. Estell Harpin  Time MD responded:  [phone]

## 2014-07-27 NOTE — ED Notes (Signed)
[facility] called at 1222 and spoke to April Shore. Referral number [id number]. Per [facility] pt only approp. For possible eye donation. Per Dr. Estell Harpin Dr. Leanord Hawking made aware of death and pt not an ME case.

## 2014-07-27 NOTE — ED Notes (Signed)
cbg 225  

## 2014-07-27 NOTE — ED Notes (Signed)
[facility] contacted and family to meet there to speak with [facility] at 1400 today.

## 2014-07-27 NOTE — ED Notes (Signed)
Respiratory at bedside at this time

## 2014-07-27 NOTE — ED Notes (Signed)
Dr. Leanord Hawking returned my call for Dr. Estell Harpin.

## 2014-07-27 NOTE — ED Provider Notes (Addendum)
CSN: [id number]     Arrival date & time 25-Jul-2014  7829 History  This chart was scribed for Benny Lennert, MD by Karle Plumber, ED Scribe. This patient was seen in room APA02/APA02 and the patient's care was started at 8:32 AM.  Chief Complaint  Patient presents with  . Cardiac Arrest   Patient is a 68 y.o. male presenting with altered mental status. The history is provided by the EMS personnel (pt found unresponsive without pulse or breathing.  unknown down time.  pt given 4 epis and d50). No language interpreter was used.  Altered Mental Status Presenting symptoms: unresponsiveness   Severity:  Severe Most recent episode:  Today Episode history:  Single Timing:  Constant Progression:  Unchanged Chronicity:  New Context: dementia    LEVEL 5 CAVEAT- Full history could not be obtained due to pt being intubated and unresponsive.  HPI Comments:  [name] is a 68 y.o. male with PMH DM, HTN, hyperlipidemia brought in by EMS from [facility] facility, who presents to the Emergency Department in cardiac arrest and was found unresponsive for an unknown amount of time. EMS reports that his CBG was 33 when he was first found and was given an amp of D50 which brought it up to only 36. He was also given Glucagon and 4 rounds of epinephrine.  Past Medical History  Diagnosis Date  . Hyperlipemia   . Diabetes   . Hemiplegia   . Vertebral artery occlusion   . Substance abuse   . Hypertension   . Cerebral artery occlusion with cerebral infarction 08/22/2011   History reviewed. No pertinent past surgical history. No family history on file. History  Substance Use Topics  . Smoking status: Former Smoker -- 50 years  . Smokeless tobacco: Never Used  . Alcohol Use: No    Review of Systems  Unable to perform ROS: Mental status change   LEVEL 5 CAVEAT- Full history could not be obtained due to pt being intubated and unresponsive.  Allergies  Review of patient's allergies  indicates no known allergies.  Home Medications   Prior to Admission medications   Medication Sig Start Date End Date Taking? Authorizing Provider  acetaminophen (TYLENOL) 325 MG tablet Take 650 mg by mouth every 4 (four) hours as needed for moderate pain.   Yes Historical Provider, MD  amoxicillin-clavulanate (AUGMENTIN) 875-125 MG per tablet Take 1 tablet by mouth 2 (two) times daily. 06/29/14 07/05/14 Yes Historical Provider, MD  clopidogrel (PLAVIX) 75 MG tablet Take 75 mg by mouth daily with breakfast.   Yes Historical Provider, MD  docusate (COLACE) 50 MG/5ML liquid Take 100 mg by mouth 2 (two) times daily.   Yes Historical Provider, MD  feeding supplement, GLUCERNA SHAKE, (GLUCERNA SHAKE) LIQD Take 237 mLs by mouth daily. 11/03/13  Yes Adeline Joselyn Glassman, MD  FLUoxetine (PROZAC) 10 MG capsule Take 10 mg by mouth daily.  10/10/13  Yes Historical Provider, MD  folic acid (FOLVITE) 1 MG tablet Take 1 mg by mouth daily.   Yes Historical Provider, MD  gabapentin (NEURONTIN) 100 MG capsule 200 mg 2 (two) times daily.    Yes Historical Provider, MD  levETIRAcetam (KEPPRA) 500 MG tablet Take 1 tablet (500 mg total) by mouth 2 (two) times daily. 11/03/13  Yes Adeline C Viyuoh, MD  lisinopril (PRINIVIL,ZESTRIL) 20 MG tablet Take 20 mg by mouth daily.   Yes Historical Provider, MD  metFORMIN (GLUCOPHAGE) 1000 MG tablet Take 1,000 mg by mouth 2 (two)  times daily with a meal.   Yes Historical Provider, MD  Multiple Vitamins-Minerals (CERTAGEN PO) Take 1 tablet by mouth daily.   Yes Historical Provider, MD  polyethylene glycol (MIRALAX / GLYCOLAX) packet Take 17 g by mouth daily.   Yes Historical Provider, MD  pravastatin (PRAVACHOL) 20 MG tablet Take 20 mg by mouth at bedtime.    Yes Historical Provider, MD   Triage Vitals: BP 190/120  Pulse 72  Resp 15  Ht  (1.803 m)  Wt 165 lb (74.844 kg)  BMI 23.02 kg/m2  SpO2 100% Physical Exam  Constitutional: He appears well-developed.  HENT:  Head:  Normocephalic.  Both pupils fixed and dialated  Eyes: Conjunctivae and EOM are normal. No scleral icterus.  Neck: Neck supple. No thyromegaly present.  Cardiovascular: Normal rate and regular rhythm.  Exam reveals no gallop and no friction rub.   No murmur heard. Pulmonary/Chest: No stridor. He has no wheezes. He has no rales. He exhibits no tenderness.  Pt inturbated  Abdominal: He exhibits no distension.  Musculoskeletal:  Pt has contractures in right arm.   He does not move any extremities to stimuli  Lymphadenopathy:    He has no cervical adenopathy.  Neurological: He exhibits normal muscle tone. Coordination abnormal.  Pt does not respond to any verbal or painful stimuli  Skin: No rash noted. No erythema.    ED Course  Procedures (including critical care time) DIAGNOSTIC STUDIES:   COORDINATION OF CARE: 8:42 AM- Will order lab work and CXR.  Medications  DOPamine (INTROPIN) 800 mg in dextrose 5 % 250 mL (3.2 mg/mL) infusion (5 mcg/kg/min  74.8 kg Intravenous New Bag/Given 2014/07/24 1025)  sodium chloride 0.9 % bolus 1,000 mL (0 mLs Intravenous Stopped Jul 24, 2014 0931)  dextrose 50 % solution 50 mL (50 mLs Intravenous Given 07/24/2014 0848)  sodium bicarbonate injection 150 mEq (150 mEq Intravenous Given 2014-07-24 0916)  sodium chloride 0.9 % bolus 1,000 mL (0 mLs Intravenous Stopped 07-24-2014 1026)  sodium chloride 0.9 % bolus 1,000 mL (1,000 mLs Intravenous New Bag/Given 07-24-2014 0948)  cefTRIAXone (ROCEPHIN) 1 g in dextrose 5 % 50 mL IVPB (1 g Intravenous New Bag/Given 07-24-2014 1103)    Labs Review Labs Reviewed  CBC WITH DIFFERENTIAL - Abnormal; Notable for the following:    WBC 28.1 (*)    RBC 4.11 (*)    Hemoglobin 12.7 (*)    MCV 102.4 (*)    Neutro Abs 19.1 (*)    Lymphs Abs 6.4 (*)    Monocytes Absolute 2.5 (*)    All other components within normal limits  COMPREHENSIVE METABOLIC PANEL - Abnormal; Notable for the following:    Potassium 6.0 (*)    CO2 <7 (*)    Glucose, Bld  341 (*)    BUN 84 (*)    Creatinine, Ser 9.04 (*)    Total Protein 5.5 (*)    Albumin 2.6 (*)    AST 217 (*)    ALT 188 (*)    Total Bilirubin 0.2 (*)    GFR calc non Af Amer 5 (*)    GFR calc Af Amer 6 (*)    All other components within normal limits  TROPONIN I - Abnormal; Notable for the following:    Troponin I 0.52 (*)    All other components within normal limits  URINALYSIS, ROUTINE W REFLEX MICROSCOPIC - Abnormal; Notable for the following:    Hgb urine dipstick LARGE (*)    Ketones, ur 15 (*)  Protein, ur 100 (*)    All other components within normal limits  PRO B NATRIURETIC PEPTIDE - Abnormal; Notable for the following:    Pro B Natriuretic peptide (BNP) 1176.0 (*)    All other components within normal limits  LACTIC ACID, PLASMA - Abnormal; Notable for the following:    Lactic Acid, Venous 17.5 (*)    All other components within normal limits  BLOOD GAS, ARTERIAL - Abnormal; Notable for the following:    pCO2 arterial 21.6 (*)    pO2, Arterial 574.0 (*)    Allens test (pass/fail) NOT INDICATED (*)    All other components within normal limits  URINE MICROSCOPIC-ADD ON - Abnormal; Notable for the following:    Squamous Epithelial / LPF FEW (*)    Bacteria, UA MANY (*)    All other components within normal limits  BLOOD GAS, ARTERIAL - Abnormal; Notable for the following:    pCO2 arterial 20.9 (*)    pO2, Arterial 132.0 (*)    Allens test (pass/fail) NOT INDICATED (*)    All other components within normal limits  CBG MONITORING, ED - Abnormal; Notable for the following:    Glucose-Capillary 225 (*)    All other components within normal limits  CULTURE, BLOOD (ROUTINE X 2)  CULTURE, BLOOD (ROUTINE X 2)  URINE CULTURE  PROCALCITONIN    Imaging Review Ct Head Wo Contrast  07-02-2014   CLINICAL DATA:  Cardiac arrest.  EXAM: CT HEAD WITHOUT CONTRAST  CT NECK WITHOUT CONTRAST  TECHNIQUE: Contiguous axial images were obtained from the base of the skull through the  vertex without contrast. Multidetector CT imaging of the neck was performed using the standard protocol without intravenous contrast.  COMPARISON:  MR brain 01/03/2014  FINDINGS: CT HEAD FINDINGS  Re- demonstrated right parietal encephalomalacia compatible with old right MCA infarct. Periventricular and subcortical white matter hypodensity compatible with chronic small vessel ischemic disease. No evidence for acute intracranial hemorrhage, mass lesion, fluid collection or acute infarct. Visualization of the skullbase is limited due to motion artifact. Catheter tubing coursing through the left naris. Mucosal thickening within the sphenoid sinus and ethmoid air cells. Mastoid air cells are unremarkable. Calvarium is intact.  CT NECK FINDINGS  Reversal of the normal cervical lordosis. Multilevel degenerative disc disease most pronounced at C5-6 and C6-7. Right-sided C4-5 facet degenerative change. No evidence for acute cervical spine fracture.  IMPRESSION: No evidence for acute intracranial process.  No evidence for acute cervical spine fracture.  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   Electronically Signed   By: Annia Belt M.D.   On: July 02, 2014 11:10   Ct Cervical Spine Wo Contrast  July 02, 2014   CLINICAL DATA:  Cardiac arrest.  EXAM: CT HEAD WITHOUT CONTRAST  CT NECK WITHOUT CONTRAST  TECHNIQUE: Contiguous axial images were obtained from the base of the skull through the vertex without contrast. Multidetector CT imaging of the neck was performed using the standard protocol without intravenous contrast.  COMPARISON:  MR brain 01/03/2014  FINDINGS: CT HEAD FINDINGS  Re- demonstrated right parietal encephalomalacia compatible with old right MCA infarct. Periventricular and subcortical white matter hypodensity compatible with chronic small vessel ischemic disease. No evidence for acute intracranial hemorrhage, mass lesion, fluid collection or acute infarct. Visualization of the skullbase is limited due to motion  artifact. Catheter tubing coursing through the left naris. Mucosal thickening within the sphenoid sinus and ethmoid air cells. Mastoid air cells are unremarkable. Calvarium is intact.  CT NECK FINDINGS  Reversal  of the normal cervical lordosis. Multilevel degenerative disc disease most pronounced at C5-6 and C6-7. Right-sided C4-5 facet degenerative change. No evidence for acute cervical spine fracture.  IMPRESSION: No evidence for acute intracranial process.  No evidence for acute cervical spine fracture.  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   Electronically Signed   By: Annia Belt M.D.   On: Jul 10, 2014 11:10   Dg Chest Portable 1 View  July 10, 2014   CLINICAL DATA:  Cardiac arrest, diabetes, hypertension  EXAM: PORTABLE CHEST - 1 VIEW  COMPARISON:  Portable exam 0858 hr compared to 10/31/2013  FINDINGS: Tip of endotracheal tube projects 6.9 cm above carina.  Nasogastric tube extends into abdomen.  External pacing leads and EKG leads noted.  Normal heart size, mediastinal contours and pulmonary vascularity.  Minimal RIGHT basilar atelectasis.  No definite infiltrate, pleural effusion or pneumothorax.  IMPRESSION: Minimal RIGHT basilar atelectasis.   Electronically Signed   By: Ulyses Southward M.D.   On: 2014-07-10 09:15     EKG Interpretation   Date/Time:  Friday 10-Jul-2014 08:41:22 EDT Ventricular Rate:  70 PR Interval:  168 QRS Duration: 117 QT Interval:  544 QTC Calculation: 587 R Axis:   -127 Text Interpretation:  Sinus rhythm Atrial premature complex Nonspecific  intraventricular conduction delay Low voltage with right axis deviation  Repol abnrm, severe global ischemia (LM/MVD) Confirmed by Tayla Panozzo  MD,  Wells Mabe ([id number]) on July 10, 2014 11:38:43 AM     CRITICAL CARE Performed by: Kyndel Egger L Total critical care time: 65 Critical care time was exclusive of separately billable procedures and treating other patients. Critical care was necessary to treat or prevent imminent or  life-threatening deterioration. Critical care was time spent personally by me on the following activities: development of treatment plan with patient and/or surrogate as well as nursing, discussions with consultants, evaluation of patient's response to treatment, examination of patient, obtaining history from patient or surrogate, ordering and performing treatments and interventions, ordering and review of laboratory studies, ordering and review of radiographic studies, pulse oximetry and re-evaluation of patient's condition. The pt still has fixed and dialated pupils.  He still does not respond to verbal or painful stimuli.  He had an abg with a unobtainable ph and after 3 bicarbs he had a venous ph that was still so low that it did not register.   I felt like with the pts history and condition that we should let the pt have confort care only and pass away peacefully.  I spoke with critical care and the critical care specialist reviewed the labs with me and felt that I was making the appropriate decision for the pt.   I spoke with the patients sister.  She makes all the medical decisions for the pt.  She stated he had dementia and many strokes.  He could not move his right arm or leg and he had limited movement on the left.  She stated that she agreed with only comfort care.     MDM   Final diagnoses:  None   Pt died at 11:59am.  Dr. Leanord Hawking notified and will sign death certificate   I personally performed the services described in this documentation, which was scribed in my presence. The recorded information has been reviewed and is accurate.    Benny Lennert, MD 2014-07-10 1144  Benny Lennert, MD 07/10/14 1230

## 2014-07-27 NOTE — ED Notes (Signed)
ER MD contacting critical care at [facility]. Family wants comfort care at this time.

## 2014-07-27 DEATH — deceased

## 2014-11-14 ENCOUNTER — Ambulatory Visit: Payer: Medicare Other | Admitting: Adult Health

## 2016-08-15 IMAGING — CT CT HEAD W/O CM
4 of 9 series · 14 of 47 positions shown, 15 images · non-contrast
Comparison: MR brain 01/03/2014

CLINICAL DATA: Cardiac arrest.

EXAM:
CT HEAD WITHOUT CONTRAST
CT NECK WITHOUT CONTRAST
TECHNIQUE: Contiguous axial images were obtained from the base of the skull
through the vertex without contrast. Multidetector CT imaging of the
neck was performed using the standard protocol without intravenous
contrast.

[Series 2: headseq 4.8 h37s · axial · 0.47mm/px · z∈[+247,+422]mm · 3 of 36 slices shown, 4 images]
[im 1/36  brain]
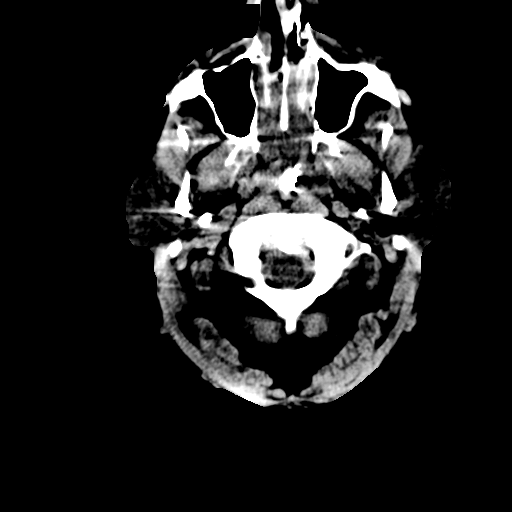
[im 1/36  bone]
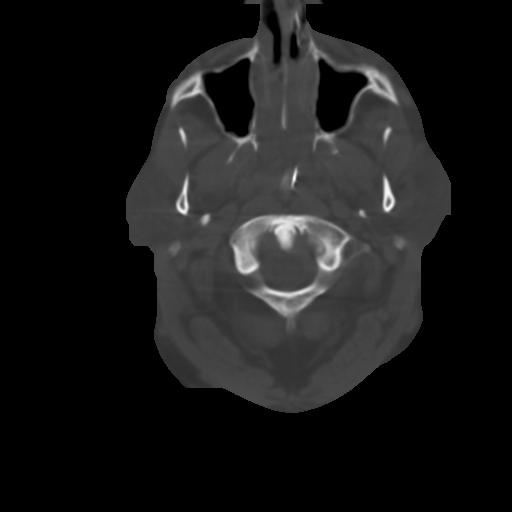
[im 18/36  brain]
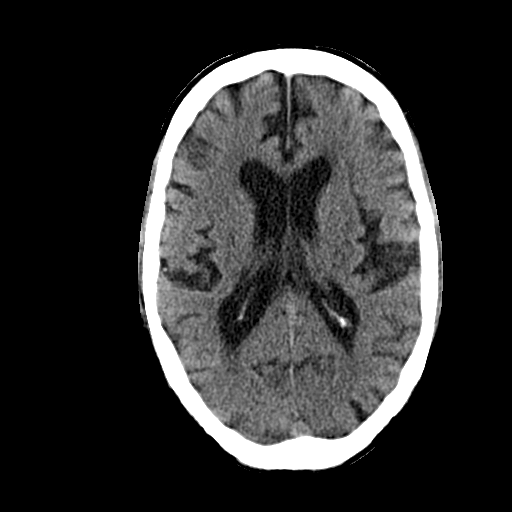
[im 36/36  brain]
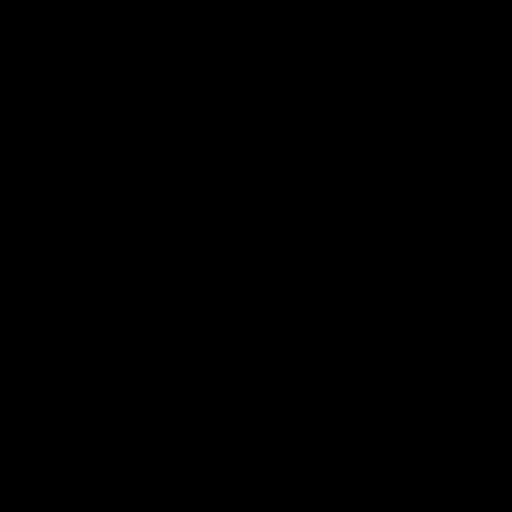

[Series 7: sagittal bone 2.0 · sagittal · 0.28mm/px · 2 of 60 slices shown]
[im 20/60  brain]
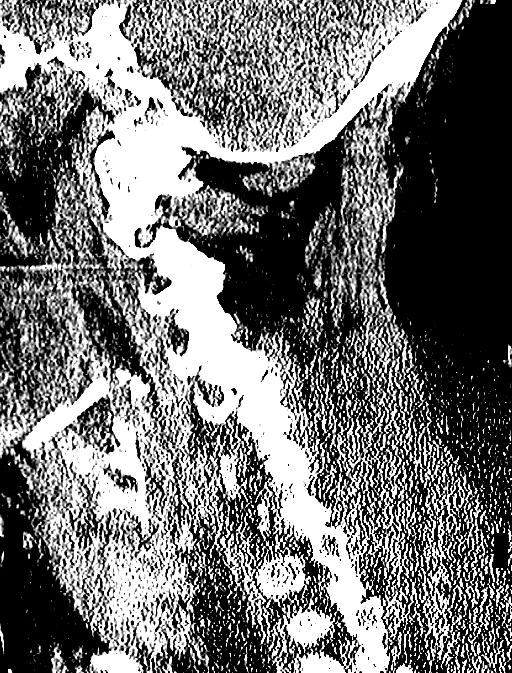
[im 40/60  brain]
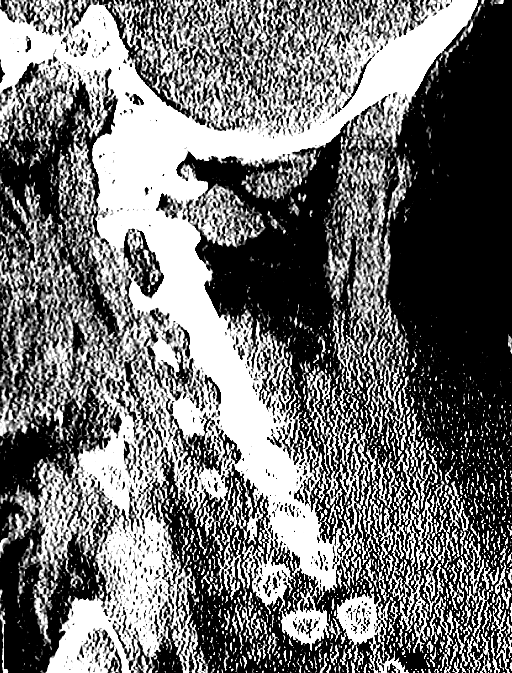

[Series 8: coronal bone 2.0 · coronal · 0.22mm/px · 3 of 53 slices shown]
[im 14/53  brain]
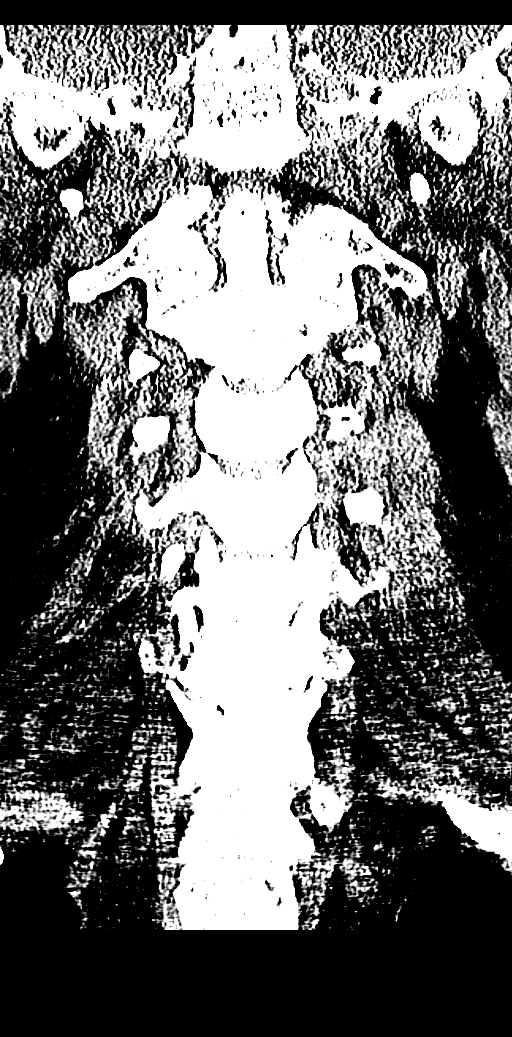
[im 27/53  brain]
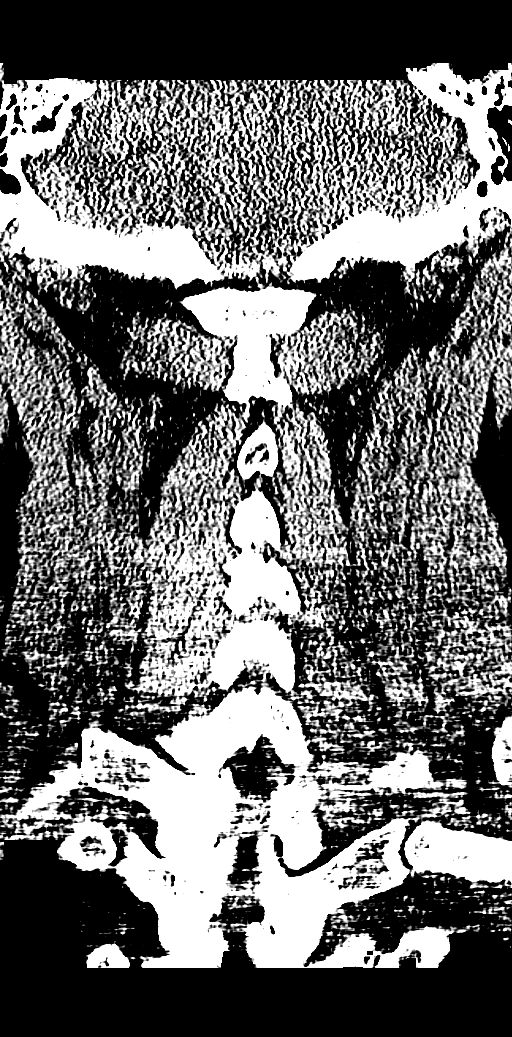
[im 40/53  brain]
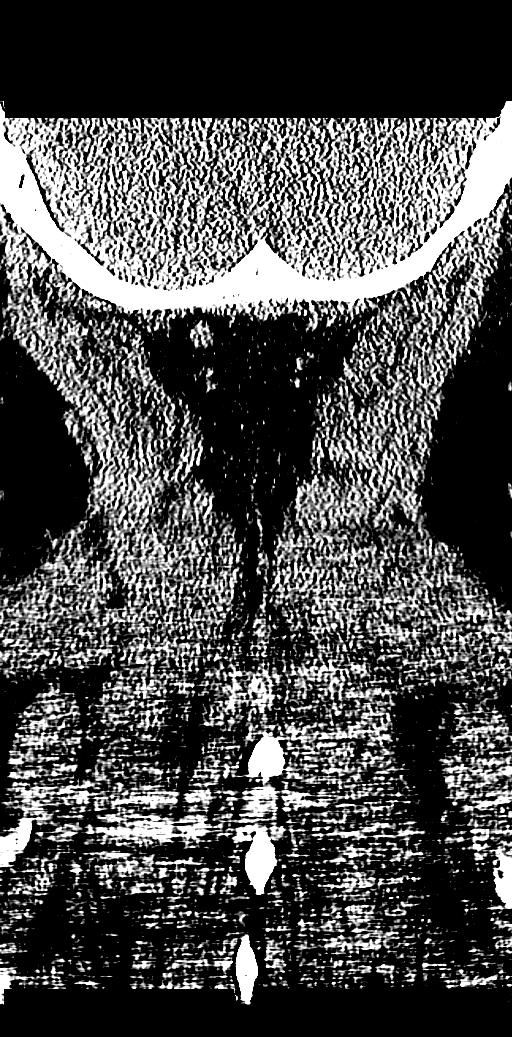

[Series 9: axial bone 2.0 · axial · 0.22mm/px · z∈[+52,+179]mm · 6 of 113 slices shown]
[im 12/113  bone]
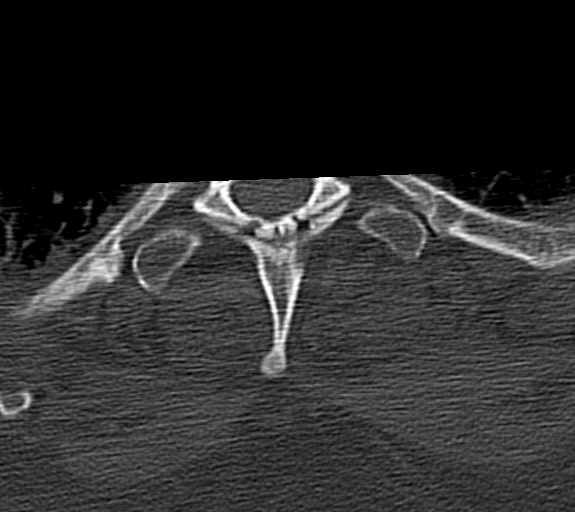
[im 23/113  bone]
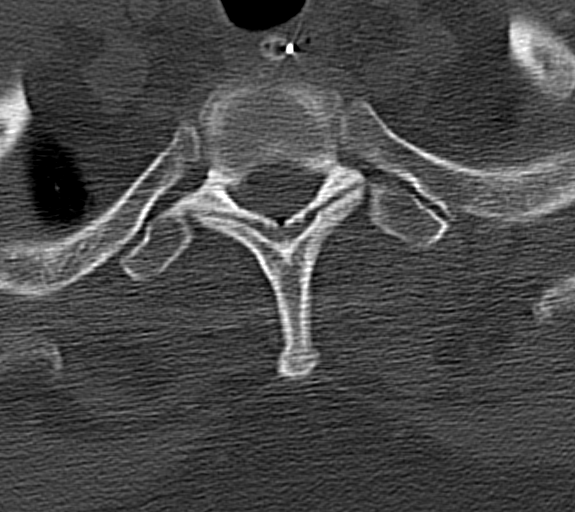
[im 34/113  bone]
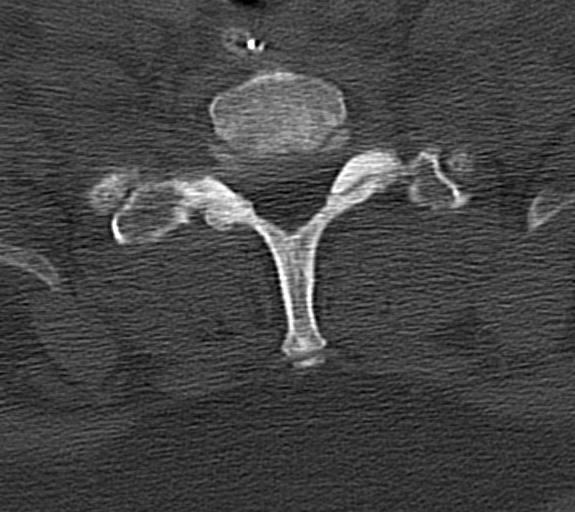
[im 45/113  bone]
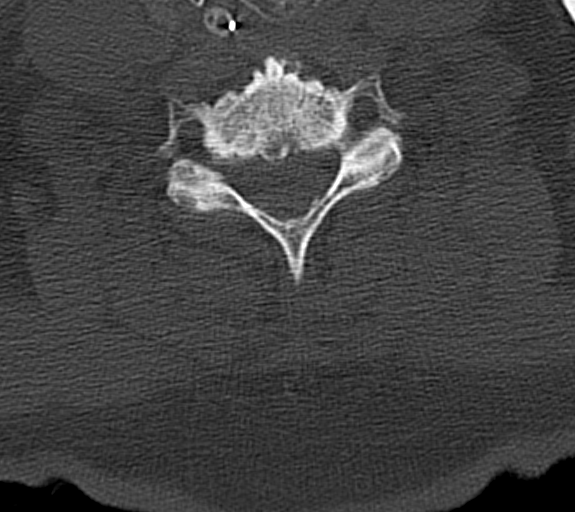
[im 68/113  bone]
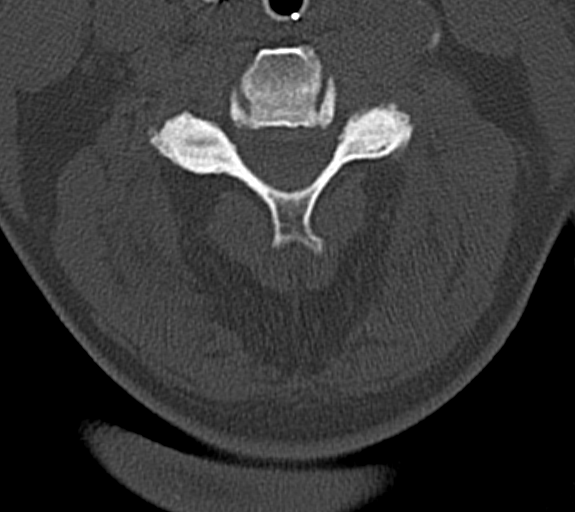
[im 79/113  bone]
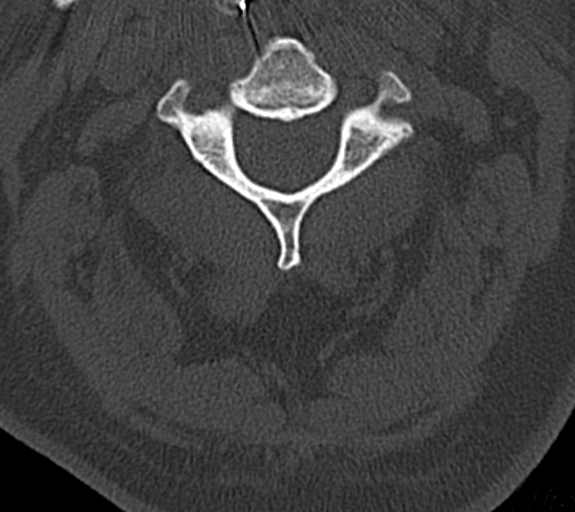

[14 of 47 positions shown; findings below may reference images not displayed]

FINDINGS: CT HEAD FINDINGS

Re- demonstrated right parietal encephalomalacia compatible with old
right MCA infarct. Periventricular and subcortical white matter
hypodensity compatible with chronic small vessel ischemic disease.
No evidence for acute intracranial hemorrhage, mass lesion, fluid
collection or acute infarct. Visualization of the skullbase is
limited due to motion artifact. Catheter tubing coursing through the
left naris. Mucosal thickening within the sphenoid sinus and ethmoid
air cells. Mastoid air cells are unremarkable. Calvarium is intact.

CT NECK FINDINGS

Reversal of the normal cervical lordosis. Multilevel degenerative
disc disease most pronounced at C5-6 and C6-7. Right-sided C4-5
facet degenerative change. No evidence for acute cervical spine
fracture.
IMPRESSION: No evidence for acute intracranial process.

No evidence for acute cervical spine fracture.

Old right MCA distribution infarct.
